# Patient Record
Sex: Female | Born: 1940 | Race: White | Hispanic: No | Marital: Married
Health system: Midwestern US, Community
[De-identification: ages and names within clinical notes are randomized; demographics above are authoritative.]

## PROBLEM LIST (undated history)

## (undated) DIAGNOSIS — E7800 Pure hypercholesterolemia, unspecified: Secondary | ICD-10-CM

## (undated) DIAGNOSIS — E119 Type 2 diabetes mellitus without complications: Secondary | ICD-10-CM

## (undated) DIAGNOSIS — I10 Essential (primary) hypertension: Secondary | ICD-10-CM

## (undated) DIAGNOSIS — F329 Major depressive disorder, single episode, unspecified: Secondary | ICD-10-CM

## (undated) DIAGNOSIS — F32A Depression, unspecified: Secondary | ICD-10-CM

## (undated) HISTORY — PX: TOTAL KNEE ARTHROPLASTY: SHX125

## (undated) HISTORY — PX: CHOLECYSTECTOMY: SHX55

## (undated) HISTORY — PX: TOTAL HIP ARTHROPLASTY: SHX124

## (undated) HISTORY — PX: APPENDECTOMY: SHX54

---

## 2012-02-23 ENCOUNTER — Inpatient Hospital Stay
Admit: 2012-02-23 | Discharge: 2012-02-23 | Disposition: A | Discharge status: Home / Self Care | Attending: Emergency Medicine

## 2012-02-23 ENCOUNTER — Inpatient Hospital Stay: Admit: 2012-02-23 | Discharge: 2012-02-23 | Disposition: A | Discharge status: Home / Self Care

## 2012-02-23 MED ORDER — TRAMADOL HCL 50 MG PO TABS
50 MG | ORAL_TABLET | Freq: Four times a day (QID) | ORAL | Status: AC | PRN
Start: 2012-02-23 — End: ?

## 2012-02-23 NOTE — ED Provider Notes (Signed)
[facility]  eMERGENCY dEPARTMENT eNCOUnter          CHIEF COMPLAINT       Chief Complaint   Patient presents with   . Fall   . Hip Pain     Lt and thigh       Nurses Notes reviewed and I agree except as noted in the HPI.      HISTORY OF PRESENT ILLNESS    Joyce White is a 71 y.o. female who presents Pain in her left hip and thigh after slipping in the shower last Friday.  She states that she did not fall into the ground.  She rates her pain as 6/10 on a pain scale, nonradiating, achy, constant.  She states she is able to walk on it.  She denies any other complaints at this time, denies any chest pain, shortness of breath, dizziness prior to slipping.  Denies hitting her head or any loss of consciousness.     REVIEW OF SYSTEMS     Review of Systems   Constitutional: Negative for fever and chills.   HENT: Negative for ear pain, sore throat, rhinorrhea and neck pain.    Eyes: Negative for discharge.   Respiratory: Negative for cough and shortness of breath.    Cardiovascular: Negative for chest pain.   Gastrointestinal: Negative for nausea, vomiting, abdominal pain and diarrhea.   Genitourinary: Negative for dysuria, vaginal discharge, difficulty urinating and pelvic pain.   Musculoskeletal: Positive for arthralgias. Negative for back pain and joint swelling.   Skin: Negative for color change.   Neurological: Negative for dizziness, seizures, syncope and headaches.          PAST MEDICAL HISTORY    has a past medical history of Diabetes mellitus.    SURGICAL HISTORY      has past surgical history that includes Cholecystectomy; fracture surgery; Appendectomy; and Tubal ligation.    CURRENT MEDICATIONS       Previous Medications    LISINOPRIL (PRINIVIL;ZESTRIL) 10 MG TABLET    Take 10 mg by mouth daily.    LOVASTATIN PO    Take  by mouth.    METOPROLOL TARTRATE    by Does not apply route.       ALLERGIES     is allergic to aspirin.    FAMILY HISTORY     has no family status information on file.  family  history is not on file.    SOCIAL HISTORY      reports that she has never smoked. She does not have any smokeless tobacco history on file. She reports that she does not drink alcohol or use illicit drugs.    PHYSICAL EXAM     INITIAL VITALS:  height is 5\' 4"  (1.626 m) and weight is 217 lb (98.431 kg). Her oral temperature is 98.4 F (36.9 C). Her blood pressure is 140/62 and her pulse is 56. Her respiration is 18 and oxygen saturation is 99%.    Physical Exam   Nursing note and vitals reviewed.  Constitutional: She is oriented to person, place, and time. She appears well-developed and well-nourished.   HENT:   Head: Atraumatic.   Right Ear: External ear normal.   Left Ear: External ear normal.   Eyes: Conjunctivae are normal. Right eye exhibits no discharge. Left eye exhibits no discharge. No scleral icterus.   Neck: Neck supple. No tracheal deviation present.   Cardiovascular: Normal rate and normal heart sounds.  Exam reveals no  gallop and no friction rub.    No murmur heard.  Pulmonary/Chest: Effort normal and breath sounds normal. No stridor. No respiratory distress. She has no wheezes. She has no rales.   Abdominal: Soft. There is no tenderness.   Musculoskeletal: She exhibits no edema and no tenderness.        Right hip: Normal.        Left hip: She exhibits decreased range of motion. She exhibits no tenderness, no bony tenderness, no swelling, no crepitus, no deformity and no laceration.        Left knee: Normal.        Left ankle: Normal.   Neurological: She is alert and oriented to person, place, and time. Coordination normal.   Skin: Skin is warm and dry. She is not diaphoretic.   Psychiatric: She has a normal mood and affect. Her behavior is normal. Judgment and thought content normal.         DIFFERENTIAL DIAGNOSIS:   Hip fracture  Sprain/strain    DIAGNOSTIC RESULTS     EKG: All EKG's are interpreted by the Emergency Department Physician who either signs or Co-signs this chart in the absence of a  cardiologist.  EKG    None      RADIOLOGY: non-plain film images(s) such as CT, Ultrasound and MRI are read by the radiologist.  The patient had a 2 view X-ray of the left hip which demonstrates no acute findings.   [x]  Visualized by me   [x]  Radiologist's Wet Read Report Reviewed   []  Discussed with Radiologist.    LABS:   Labs Reviewed - No data to display    EMERGENCY DEPARTMENT COURSE:   Vitals:    Filed Vitals:    02/23/12 1206   BP: 140/62   Pulse: 56   Temp: 98.4 F (36.9 C)   TempSrc: Oral   Resp: 18   Height: 5\' 4"  (1.626 m)   Weight: 217 lb (98.431 kg)   SpO2: 99%     This is a 71 year old female who presents to the ED where she was evaluated by myself and Dr. Terrilyn Saver.  The patient presents with pain in her left hip and thigh after slipping in the shower last week.  On examination and unable to palpate any tenderness to palpation.  Patient does have decreased range of motion due to pain.  As the pulses are noted to be intact.  Cap refill is less than 3 seconds.  X-rays were obtained and demonstrate no acute findings.  At this time we feel this is most likely a  sprain.  We will discharge her home.  I did instruct her to rest, and Try to stay off of it.    CRITICAL CARE: None      CONSULTS:  None    PROCEDURES:  None    FINAL IMPRESSION      1. Sprain of left hip          DISPOSITION/PLAN   This time the patient is in stable condition will be discharged home.  She is given a prescription for Ultram.  She is instructed to follow up with her family doctor next week for a recheck.  She was instructed to minimal weight bear for the next few days, and to return to the ED for any worsening in her condition, or for any other concerns deem necessary.  She expresses understanding and agreement with the plan of care.    PATIENT REFERRED TO:  In 1 week  Follow up with your doctor      DISCHARGE MEDICATIONS:  New Prescriptions    TRAMADOL (ULTRAM) 50 MG TABLET    Take 1 tablet by mouth every 6 hours as needed for  Pain.       (Please note that portions of this note were completed with a voice recognition program.  Efforts were made to edit the dictations but occasionally words are mis-transcribed.)    Lan Mcneill L. Gaynell Face, PA              Charlee Squibb L. [place], [place]  02/23/12 1340

## 2012-02-23 NOTE — ED Notes (Signed)
Pt stable. Discharge instructions given, verbalized understanding. Rx given. To lobby without difficulty.    Johnnette Litter, RN  02/23/12 1343

## 2012-02-23 NOTE — Discharge Instructions (Signed)
Follow up with your doctor in 1 week for a recheck. Minimal weight bearing for the next 3 days. Take ultram as directed for pain. Return to the ED for any worsening in your condition, or for any other concerns deemed necessary.   Hip Injury  You have a been diagnosed with a hip injury. Falls can cause fractures to the pelvis and hip as well as very painful bruises. These are called 'hip pointers'. Muscle injuries, arthritis, sciatica, and bursitis and can also cause severe pelvic or hip pain. An x-ray exam will usually show a fractured bone, but sometimes other studies like a CT scan or MRI may be needed to be certain about the diagnosis. All painful hip injuries should be treated like there might be a fracture until they are better or determined not to be fractures.  Rest in bed over the next 3-4 days, or as long as you have severe pain. You should not bear weight on your injured hip. Use crutches or a walker. Ice packs can be applied to the injury site for 20 minutes every 2-4 hours for several days. Pain medicine may be needed to help you rest, especially at night.   A follow-up exam and further studies to determine the cause of your pain are important if your symptoms do not improve rapidly over the next week.   SEEK IMMEDIATE MEDICAL CARE IF:   You re-injure your hip.   You develop more pain, a fever, inability to walk, or other problems.  MAKE SURE YOU:    Understand these instructions.   Will watch your condition.   Will get help right away if you are not doing well or get worse.  Document Released: 12/01/2004 Document Revised: 10/13/2011 Document Reviewed: 02/12/2009  [facility] Patient Information 2012 [place].

## 2012-02-23 NOTE — ED Notes (Signed)
Patient fell injurying her hip.  Patient decided to go to [facility] ED to be seen. Patient was not seen at [facility].       Silvana Newness, RN  02/23/12 1056

## 2012-02-23 NOTE — ED Notes (Signed)
Pt in room 34 states slipped in a hotel shower on Friday. Pt states she was stepping out and foot slipped. Pt states she did not go down but twisted leg. C/o Lt hip and though pain 5/10 increase with pressure or movement. Neg LOC. Respirations easy unlabored. Assessment complete.     Johnnette Litter, RN  02/23/12 1210

## 2012-02-23 NOTE — ED Notes (Signed)
Pt resting quietly with eyes closed appears comfortable. Respirations easy unlabored. Side rails up x2.     Johnnette Litter, RN  02/23/12 1320

## 2016-08-06 NOTE — Telephone Encounter (Signed)
Bethany(daughter) Complications after procedure on back. Pain meds not helping. Not sleeping          No answer, left message

## 2016-08-07 ENCOUNTER — Inpatient Hospital Stay: Admit: 2016-08-07 | Discharge: 2016-08-07 | Disposition: A | Discharge status: Home / Self Care | Payer: MEDICARE

## 2016-08-07 DIAGNOSIS — M5416 Radiculopathy, lumbar region: Secondary | ICD-10-CM

## 2016-08-07 HISTORY — PX: BACK SURGERY: SHX140

## 2016-08-07 LAB — CBC WITH AUTO DIFFERENTIAL
Basophils Absolute: 0 10*3/uL (ref 0.0–0.1)
Basophils: 0.2 %
Eosinophils Absolute: 0 10*3/uL (ref 0.0–0.4)
Eosinophils: 0.1 %
Hematocrit: 41.3 % (ref 37.0–47.0)
Hemoglobin: 14 gm/dl (ref 12.0–16.0)
Lymphocytes Absolute: 1.2 10*3/uL (ref 1.0–4.8)
Lymphocytes: 13.8 %
MCH: 30.5 pg (ref 27.0–31.0)
MCHC: 33.8 gm/dl (ref 33.0–37.0)
MCV: 90 fL (ref 81.0–99.0)
MPV: 9.1 mcm (ref 7.4–10.4)
Monocytes Absolute: 0.5 10*3/uL (ref 0.4–1.3)
Monocytes: 5.3 %
Platelets: 226 10*3/uL (ref 130–400)
RBC Morphology: NORMAL
RBC: 4.58 10*6/uL (ref 4.20–5.40)
RDW: 13.4 % (ref 11.5–14.5)
Seg Neutrophils: 80.6 %
Segs Absolute: 6.9 10*3/uL (ref 1.8–7.7)
WBC: 8.6 10*3/uL (ref 4.8–10.8)
nRBC: 0 /100 wbc

## 2016-08-07 LAB — BASIC METABOLIC PANEL
BUN: 17 mg/dL (ref 7–22)
CO2: 30 meq/L (ref 23–33)
Calcium: 9.1 mg/dL (ref 8.5–10.5)
Chloride: 97 meq/L — ABNORMAL LOW (ref 98–111)
Creatinine: 0.8 mg/dL (ref 0.4–1.2)
Glucose: 315 mg/dL — ABNORMAL HIGH (ref 70–108)
Potassium: 4.3 meq/L (ref 3.5–5.2)
Sodium: 138 meq/L (ref 135–145)

## 2016-08-07 LAB — OSMOLALITY: Osmolality Calc: 289.3 mOsmol/kg (ref >=275.0)

## 2016-08-07 LAB — GLOMERULAR FILTRATION RATE, ESTIMATED: Est, Glom Filt Rate: 70 mL/min/{1.73_m2} — AB

## 2016-08-07 LAB — ANION GAP: Anion Gap: 11 meq/L (ref 8.0–16.0)

## 2016-08-07 MED ORDER — LIDOCAINE 5 % EX PTCH
5 % | Freq: Every day | CUTANEOUS | Status: DC
Start: 2016-08-07 — End: 2016-08-07
  Administered 2016-08-07: 11:00:00 1 via TRANSDERMAL

## 2016-08-07 MED ORDER — PREDNISONE 20 MG PO TABS
20 MG | ORAL_TABLET | ORAL | 0 refills | Status: AC
Start: 2016-08-07 — End: 2016-08-17

## 2016-08-07 MED ORDER — OXYCODONE-ACETAMINOPHEN 5-325 MG PO TABS
5-325 MG | ORAL_TABLET | Freq: Four times a day (QID) | ORAL | 0 refills | Status: AC | PRN
Start: 2016-08-07 — End: 2016-08-14

## 2016-08-07 MED ORDER — ONDANSETRON HCL 4 MG/2ML IJ SOLN
4 MG/2ML | Freq: Once | INTRAMUSCULAR | Status: AC
Start: 2016-08-07 — End: 2016-08-07
  Administered 2016-08-07: 11:00:00 4 mg via INTRAVENOUS

## 2016-08-07 MED ORDER — SODIUM CHLORIDE 0.9 % IV BOLUS
0.9 % | Freq: Once | INTRAVENOUS | Status: AC
Start: 2016-08-07 — End: 2016-08-07
  Administered 2016-08-07: 11:00:00 500 mL via INTRAVENOUS

## 2016-08-07 MED ORDER — FENTANYL CITRATE (PF) 100 MCG/2ML IJ SOLN
100 MCG/2ML | Freq: Once | INTRAMUSCULAR | Status: DC
Start: 2016-08-07 — End: 2016-08-07

## 2016-08-07 MED ORDER — TIZANIDINE HCL 4 MG PO TABS
4 MG | ORAL_TABLET | Freq: Four times a day (QID) | ORAL | 0 refills | Status: AC | PRN
Start: 2016-08-07 — End: ?

## 2016-08-07 MED ORDER — MORPHINE SULFATE (PF) 4 MG/ML IV SOLN
4 MG/ML | Freq: Once | INTRAVENOUS | Status: AC
Start: 2016-08-07 — End: 2016-08-07
  Administered 2016-08-07: 11:00:00 4 mg via INTRAVENOUS

## 2016-08-07 MED ORDER — TIZANIDINE HCL 4 MG PO TABS
4 MG | Freq: Once | ORAL | Status: AC
Start: 2016-08-07 — End: 2016-08-07
  Administered 2016-08-07: 11:00:00 4 mg via ORAL

## 2016-08-07 MED FILL — MORPHINE SULFATE (PF) 4 MG/ML IV SOLN: 4 mg/mL | INTRAVENOUS | Qty: 1

## 2016-08-07 MED FILL — LIDODERM 5 % EX PTCH: 5 % | CUTANEOUS | Qty: 1

## 2016-08-07 MED FILL — ONDANSETRON HCL 4 MG/2ML IJ SOLN: 4 MG/2ML | INTRAMUSCULAR | Qty: 2

## 2016-08-07 MED FILL — TIZANIDINE HCL 4 MG PO TABS: 4 MG | ORAL | Qty: 1

## 2016-08-07 NOTE — Telephone Encounter (Signed)
No further contact, will close encounter.

## 2016-08-07 NOTE — ED Provider Notes (Signed)
[facility]    CHIEF COMPLAINT       Chief Complaint   Patient presents with   ??? Back Pain       Nurses Notes reviewed and I agree except as noted in the HPI.    HISTORY OF PRESENT ILLNESS    Joyce White is a 75 y.o. female who presents to the ED for evaluation of low back pain. The patient has a history of chronic neuropathic back pain. She states she had a radiofrequency ablation at pain management done last week on her right side, noting she normally gets them done on the left side. Since then she has had increasing pain of her left lower back, radiating down the side of her left leg. She states it is a sharp, burning pain and has been constant since Wednesday. She went to urgent care and received a week supply of prednisone and Norco, with minimal relief. She states the pain is worse and awakening her from her sleep. She denies loss of bowel or bladder function, fever, chills, weight loss, or trauma. Her last MRI was in 2016.         Pain description:  Onset: progressive for 1 week  Location: left lower back  Duration: constant   Character: sharp, burning   Aggravating factors:   Radiation: down lateral aspect of left leg  Timing: 1 week  Severity: 10/10    Experienced previously: this is a chronic issue that she follows pain management for     HPI was provided by the patient.    REVIEW OF SYSTEMS     Review of Systems   Constitutional: Negative for appetite change, chills, diaphoresis, fatigue, fever and unexpected weight change.   HENT: Negative for congestion, hearing loss, postnasal drip, rhinorrhea, sinus pressure, sore throat and voice change.    Eyes: Negative for photophobia, redness and visual disturbance.   Respiratory: Negative for cough, chest tightness, shortness of breath and wheezing.    Cardiovascular: Negative for chest pain, palpitations and leg swelling.   Gastrointestinal: Negative for abdominal distention, abdominal pain, blood in stool, constipation, diarrhea,  nausea and vomiting.   Endocrine: Negative for cold intolerance, heat intolerance, polydipsia, polyphagia and polyuria.   Genitourinary: Negative for difficulty urinating, dysuria, flank pain, frequency and vaginal pain.   Musculoskeletal: Positive for back pain (left lower back and leg pain) and myalgias. Negative for arthralgias, gait problem, joint swelling, neck pain and neck stiffness.   Skin: Negative for color change, pallor and rash.   Allergic/Immunologic: Negative for immunocompromised state.   Neurological: Negative for dizziness, tremors, weakness, light-headedness, numbness and headaches.   Hematological: Does not bruise/bleed easily.   Psychiatric/Behavioral: Negative for behavioral problems, confusion, decreased concentration and suicidal ideas. The patient is nervous/anxious.         PAST MEDICAL HISTORY    has a past medical history of Arthritis; Back pain at L4-L5 level; Diabetes mellitus (HCC); Hyperlipidemia; Hypertension; and Thyroid disease.    SURGICAL HISTORY      has a past surgical history that includes Cholecystectomy; fracture surgery; Appendectomy; Tubal ligation; joint replacement; Thyroidectomy, partial (Left, 2015); and back surgery.    CURRENT MEDICATIONS       Discharge Medication List as of 08/07/2016  8:06 AM      CONTINUE these medications which have NOT CHANGED    Details   acetaminophen (TYLENOL) 500 MG tablet Take 500 mg by mouth 2 times dailyHistorical Med      lisinopril (  PRINIVIL;ZESTRIL) 10 MG tablet Take 10 mg by mouth daily.      LOVASTATIN PO Take  by mouth.      METOPROLOL TARTRATE by Does not apply route.      traMADol (ULTRAM) 50 MG tablet Take 1 tablet by mouth every 6 hours as needed for Pain., Disp-20 tablet, R-0             ALLERGIES     has No Known Allergies.    FAMILY HISTORY     indicated that her mother is deceased. She indicated that her father is deceased. She indicated that the status of her brother is unknown.  family history includes Diabetes in her  father; High Blood Pressure in her father and mother; Parkinsonism in her brother and mother; Stroke in her mother.    SOCIAL HISTORY      reports that she has never smoked. She does not have any smokeless tobacco history on file. She reports that she does not drink alcohol or use illicit drugs.    PHYSICAL EXAM     INITIAL VITALS:  height is 5\' 4"  (1.626 m) and weight is 220 lb (99.8 kg). Her oral temperature is 98 ??F (36.7 ??C). Her blood pressure is 160/68 (abnormal) and her pulse is 58. Her respiration is 20 and oxygen saturation is 94%.    Physical Exam   Constitutional: She is oriented to person, place, and time. She appears well-developed and well-nourished.   She appears in obvious discomfort   HENT:   Head: Normocephalic and atraumatic.   Right Ear: External ear normal.   Left Ear: External ear normal.   Mouth/Throat: Uvula is midline and oropharynx is clear and moist.   Eyes: Conjunctivae and EOM are normal. Pupils are equal, round, and reactive to light.   Neck: Normal range of motion. Neck supple.   Cardiovascular: Normal rate, regular rhythm, S1 normal, S2 normal, normal heart sounds and intact distal pulses.    Pulmonary/Chest: Effort normal and breath sounds normal. No respiratory distress. She exhibits no tenderness.   Abdominal: Soft. Normal appearance and bowel sounds are normal. She exhibits no distension. There is no tenderness.   Musculoskeletal: Normal range of motion.   Full passive range of motion; decreased active range of motion of left LE due to pain. Tenderness along SI joint. No deformities noted. Sensation intact.    Lymphadenopathy:     She has no cervical adenopathy.   Neurological: She is alert and oriented to person, place, and time. No cranial nerve deficit.   Skin: Skin is warm, dry and intact.   Psychiatric: She has a normal mood and affect. Her speech is normal and behavior is normal. Thought content normal.   Nursing note and vitals reviewed.      DIFFERENTIAL DIAGNOSIS:    Neuropathic pain, nerve compression, muscle strain, chronic back pain    DIAGNOSTIC RESULTS     EKG: All EKG's are interpreted by the Emergency Department Physician who either signs or Co-signs this chart in the absence of a cardiologist.    none    RADIOLOGY: non-plain film images(s) such as CT, Ultrasound and MRI are read by the radiologist.  Plain radiographic images are visualized and preliminarily interpreted by the emergency physician unless otherwise stated below.  No orders to display         LABS:   Labs Reviewed   BASIC METABOLIC PANEL - Abnormal; Notable for the following:        Result Value  Chloride 97 (*)     Glucose 315 (*)     All other components within normal limits   GLOMERULAR FILTRATION RATE, ESTIMATED - Abnormal; Notable for the following:     Est, Glom Filt Rate 70 (*)     All other components within normal limits   CBC WITH AUTO DIFFERENTIAL   ANION GAP   OSMOLALITY   URINE RT REFLEX TO CULTURE       EMERGENCY DEPARTMENT COURSE:   Vitals:    Vitals:    08/07/16 0620 08/07/16 0726 08/07/16 0731 08/07/16 0805   BP: (!) 179/99 (!) 206/86 (!) 180/94 (!) 160/68   Pulse: 64 58 58    Resp: 20 18 20     Temp:       TempSrc:       SpO2: 94% 95% 94%    Weight:       Height:           MDM    Patient was seen and evaluated in the emergency department, during my initial evaluation the patient appeared to be in significant discomfort.  Labs Were obtained no significant abnormalities were noted.  Patient was treated medications below I noted significant improvement in patient's symptoms.  I discussed my findings my plan of care with the patient and she was amenable with discharge and follow-up with her pain management specialist.  I reviewed the patient's OA RR S report, no red flags are really identified at this point.  We will change the patient's medicine to Percocet, we will provide her with Zanaflex as well, she is advised to continue to walk as tolerated.  She verbalized understanding. All here in  the emergency department she maintained stable course is appropriate for discharge.    Medications   0.9 % sodium chloride bolus (0 mLs Intravenous Stopped 08/07/16 0805)   tiZANidine (ZANAFLEX) tablet 4 mg (4 mg Oral Given 08/07/16 0718)   morphine (PF) injection 4 mg (4 mg Intravenous Given 08/07/16 0648)   ondansetron (ZOFRAN) injection 4 mg (4 mg Intravenous Given 08/07/16 1610)       Patient was seen independently by myself. The patient's final impression and disposition and plan was determined by myself.     CRITICAL CARE:   None    CONSULTS:  None    PROCEDURES:  None    FINAL IMPRESSION      1. Lumbar radiculopathy, acute          DISPOSITION/PLAN   Patient discharged in stable condition    PATIENT REFERRED TO:  Your pain doctor     Call in 1 day  For follow up and evaluation       DISCHARGE MEDICATIONS:  Discharge Medication List as of 08/07/2016  8:06 AM      START taking these medications    Details   oxyCODONE-acetaminophen (PERCOCET) 5-325 MG per tablet Take 1 tablet by mouth every 6 hours as needed for Pain  WARNING:  May cause drowsiness.  May impair ability to operate vehicles or machinery.  Do not use in combination with alcohol.  ., Disp-20 tablet, R-0Print      tiZANidine (ZANAFLEX) 4 MG tablet Take 1 tablet by mouth every 6 hours as needed (back spasm), Disp-20 tablet, R-0Print             (Please note that portions of this note were completed with a voice recognition program.  Efforts were made to edit the dictations but occasionally words are mis-transcribed.)  Provider:  I personally performed the services described in the documentation, reviewed and edited the documentation which was dictated to the scribe in my presence, and it accurately records my words and actions.    Matilynn Dacey, CNP 08/07/16 4:46 PM    Sophiamarie Nease, NP       Biannca Scantlin, NP  08/07/16 1649

## 2016-08-07 NOTE — ED Triage Notes (Signed)
Over one week ago had an ablation on right L4-5 and now has pain in the left lower back with shooting pain down left leg

## 2016-08-07 NOTE — Discharge Instructions (Signed)
Back Pain, Emergency or Urgent Symptoms: Care Instructions  Your Care Instructions  Many people have back pain at one time or another. In most cases, pain gets better with self-care that includes over-the-counter pain medicine, ice, heat, and exercises.  Unless you have symptoms of a severe injury or heart attack, you may be able to give yourself a few days before you call a doctor. But some back problems are very serious. Do not ignore symptoms that need to be checked right away.  Follow-up care is a key part of your treatment and safety. Be sure to make and go to all appointments, and call your doctor if you are having problems. It's also a good idea to know your test results and keep a list of the medicines you take.  How can you care for yourself at home?   Sit or lie in positions that are most comfortable and that reduce your pain. Try one of these positions when you lie down:   Lie on your back with your knees bent and supported by large pillows.   Lie on the floor with your legs on the seat of a sofa or chair.   Lie on your side with your knees and hips bent and a pillow between your legs.   Lie on your stomach if it does not make pain worse.   Do not sit up in bed, and avoid soft couches and twisted positions. Bed rest can help relieve pain at first, but it delays healing. Avoid bed rest after the first day.   Change positions every 30 minutes. If you must sit for long periods of time, take breaks from sitting. Get up and walk around, or lie flat.   Try using a heating pad on a low or medium setting, for 15 to 20 minutes every 2 or 3 hours. Try a warm shower in place of one session with the heating pad. You can also buy single-use heat wraps that last up to 8 hours. You can also try ice or cold packs on your back for 10 to 20 minutes at a time, several times a day. (Put a thin cloth between the ice pack and your skin.) This reduces pain and makes it easier to be active and exercise.   Take pain  medicines exactly as directed.   If the doctor gave you a prescription medicine for pain, take it as prescribed.   If you are not taking a prescription pain medicine, ask your doctor if you can take an over-the-counter medicine.  When should you call for help?  Call 911 anytime you think you may need emergency care. For example, call if:   You are unable to move a leg at all.   You have back pain with severe belly pain.   You have symptoms of a heart attack. These may include:   Chest pain or pressure, or a strange feeling in the chest.   Sweating.   Shortness of breath.   Nausea or vomiting.   Pain, pressure, or a strange feeling in the back, neck, jaw, or upper belly or in one or both shoulders or arms.   Lightheadedness or sudden weakness.   A fast or irregular heartbeat.  After you call 911, the operator may tell you to chew 1 adult-strength or 2 to 4 low-dose aspirin. Wait for an ambulance. Do not try to drive yourself.  Call your doctor now or seek immediate medical care if:   You have new or worse   symptoms in your arms, legs, chest, belly, or buttocks. Symptoms may include:   Numbness or tingling.   Weakness.   Pain.   You lose bladder or bowel control.   You have back pain and:   You have injured your back while lifting or doing some other activity. Call if the pain is severe, has not gone away after 1 or 2 days, and you cannot do your normal daily activities.   You have had a back injury before that needed treatment.   Your pain has lasted longer than 4 weeks.   You have had weight loss you cannot explain.   You are age 75 or older.   You have cancer now or have had it before.  Watch closely for changes in your health, and be sure to contact your doctor if you are not getting better as expected.  Where can you learn more?  Go to [email or web address] and sign in to your MyChart account. Enter S904 in the Search Health Information box to learn more about "Back Pain,  Emergency or Urgent Symptoms: Care Instructions."     If you do not have an account, please click on the "Sign Up Now" link.  Current as of: January 25, 2016  Content Version: 11.3   2006-2017 [employer]. Care instructions adapted under license by [facility]. If you have questions about a medical condition or this instruction, always ask your healthcare professional. [employer] disclaims any warranty or liability for your use of this information.

## 2016-08-07 NOTE — ED Notes (Signed)
Pt appears anxious, withering in pain. Left hand continuously placing pressure to left lower back. Pt rates pain 9/10 at this time. Muscle relaxer, and lidocaine patch to left lower back provided. Pt repositioned back in bed. Monitor in place. Daughter at bedside. Call light within reach. Will continue to monitor for safety and comfort.       Brantley Flingargis J Saraia Platner, RN  08/07/16 0730

## 2016-09-10 ENCOUNTER — Emergency Department (HOSPITAL_COMMUNITY)
Admission: EM | Admit: 2016-09-10 | Discharge: 2016-09-10 | Disposition: A | Discharge status: Home / Self Care | Payer: Medicare Other | Attending: Emergency Medicine | Admitting: Emergency Medicine

## 2016-09-10 ENCOUNTER — Encounter (HOSPITAL_COMMUNITY): Payer: Self-pay

## 2016-09-10 DIAGNOSIS — T368X5A Adverse effect of other systemic antibiotics, initial encounter: Secondary | ICD-10-CM | POA: Diagnosis not present

## 2016-09-10 DIAGNOSIS — T887XXA Unspecified adverse effect of drug or medicament, initial encounter: Secondary | ICD-10-CM | POA: Insufficient documentation

## 2016-09-10 DIAGNOSIS — Z96651 Presence of right artificial knee joint: Secondary | ICD-10-CM | POA: Diagnosis not present

## 2016-09-10 DIAGNOSIS — I10 Essential (primary) hypertension: Secondary | ICD-10-CM | POA: Insufficient documentation

## 2016-09-10 DIAGNOSIS — Y658 Other specified misadventures during surgical and medical care: Secondary | ICD-10-CM | POA: Diagnosis not present

## 2016-09-10 DIAGNOSIS — Z96641 Presence of right artificial hip joint: Secondary | ICD-10-CM | POA: Diagnosis not present

## 2016-09-10 DIAGNOSIS — R441 Visual hallucinations: Secondary | ICD-10-CM | POA: Insufficient documentation

## 2016-09-10 DIAGNOSIS — E119 Type 2 diabetes mellitus without complications: Secondary | ICD-10-CM | POA: Diagnosis not present

## 2016-09-10 DIAGNOSIS — T50905A Adverse effect of unspecified drugs, medicaments and biological substances, initial encounter: Secondary | ICD-10-CM

## 2016-09-10 HISTORY — DX: Depression, unspecified: F32.A

## 2016-09-10 HISTORY — DX: Major depressive disorder, single episode, unspecified: F32.9

## 2016-09-10 HISTORY — DX: Essential (primary) hypertension: I10

## 2016-09-10 HISTORY — DX: Pure hypercholesterolemia, unspecified: E78.00

## 2016-09-10 HISTORY — DX: Type 2 diabetes mellitus without complications: E11.9

## 2016-09-10 LAB — COMPREHENSIVE METABOLIC PANEL
ALBUMIN: 3.3 g/dL — AB (ref 3.5–5.0)
ALK PHOS: 69 U/L (ref 38–126)
ALT: 17 U/L (ref 14–54)
ANION GAP: 11 (ref 5–15)
AST: 21 U/L (ref 15–41)
BUN: 7 mg/dL (ref 6–20)
CO2: 25 mmol/L (ref 22–32)
Calcium: 8.9 mg/dL (ref 8.9–10.3)
Chloride: 103 mmol/L (ref 101–111)
Creatinine, Ser: 0.85 mg/dL (ref 0.44–1.00)
GFR calc non Af Amer: >60 mL/min (ref >60)
GLUCOSE: 153 mg/dL — AB (ref 65–99)
Potassium: 3.3 mmol/L — ABNORMAL LOW (ref 3.5–5.1)
SODIUM: 139 mmol/L (ref 135–145)
Total Bilirubin: 0.8 mg/dL (ref 0.3–1.2)
Total Protein: 6.1 g/dL — ABNORMAL LOW (ref 6.5–8.1)

## 2016-09-10 LAB — URINE MICROSCOPIC-ADD ON

## 2016-09-10 LAB — CBC WITH DIFFERENTIAL/PLATELET
Basophils Absolute: 0 10*3/uL (ref 0.0–0.1)
Basophils Relative: 0 %
Eosinophils Absolute: 1 10*3/uL — ABNORMAL HIGH (ref 0.0–0.7)
Eosinophils Relative: 14 %
HEMATOCRIT: 37.6 % (ref 36.0–46.0)
HEMOGLOBIN: 12.8 g/dL (ref 12.0–15.0)
LYMPHS ABS: 1.6 10*3/uL (ref 0.7–4.0)
LYMPHS PCT: 22 %
MCH: 30.8 pg (ref 26.0–34.0)
MCHC: 34 g/dL (ref 30.0–36.0)
MCV: 90.4 fL (ref 78.0–100.0)
MONO ABS: 0.6 10*3/uL (ref 0.1–1.0)
MONOS PCT: 8 %
NEUTROS ABS: 3.8 10*3/uL (ref 1.7–7.7)
NEUTROS PCT: 56 %
Platelets: 221 10*3/uL (ref 150–400)
RBC: 4.16 MIL/uL (ref 3.87–5.11)
RDW: 14 % (ref 11.5–15.5)
WBC: 6.9 10*3/uL (ref 4.0–10.5)

## 2016-09-10 LAB — URINALYSIS, ROUTINE W REFLEX MICROSCOPIC
BILIRUBIN URINE: NEGATIVE
GLUCOSE, UA: NEGATIVE mg/dL
HGB URINE DIPSTICK: NEGATIVE
Ketones, ur: 15 mg/dL — AB
NITRITE: NEGATIVE
PH: 7 (ref 5.0–8.0)
Protein, ur: NEGATIVE mg/dL
SPECIFIC GRAVITY, URINE: 1.01 (ref 1.005–1.030)

## 2016-09-10 MED ORDER — ONDANSETRON HCL 4 MG PO TABS: 4.0000 mg | ORAL_TABLET | Freq: Three times a day (TID) | ORAL | 0 refills | Status: AC | PRN

## 2016-09-10 MED ORDER — ONDANSETRON 4 MG PO TBDP
8.0000 mg | ORAL_TABLET | Freq: Once | ORAL | Status: AC
  Administered 2016-09-10: 8 mg via ORAL
  Filled 2016-09-10: qty 2

## 2016-09-10 MED ORDER — OXYCODONE-ACETAMINOPHEN 5-325 MG PO TABS
1.0000 | ORAL_TABLET | Freq: Once | ORAL | Status: AC
Start: 2016-09-10 — End: 2016-09-10
  Administered 2016-09-10: 1 via ORAL
  Filled 2016-09-10: qty 1

## 2016-09-10 NOTE — ED Provider Notes (Signed)
[facility]PT Provider Note   CSN: [id number] Arrival date & time: 09/10/16  1927     History   Chief Complaint Chief Complaint  Patient presents with  . Medication Reaction    HPI Renee Stewart is a 75 y.o. female.  She was diagnosed with urinary tract infection on October 26, and prescribed ciprofloxacin. Since then, she is at problems with nausea, and also with visual and auditory hallucinations. She is also taking oxycodone-acetaminophen which was prescribed from a pain management center. This is being given for left iliotibial band syndrome. Since the prescription was given, [place] has [place]. She denies dysuria and denies fever or chills. She has not been established with physicians locally.   The history is provided by the patient.    Past Medical History:  Diagnosis Date  . Depression   . Diabetes mellitus without complication (HCC)   . Hypercholesteremia   . Hypertension     There are no active problems to display for this patient.   Past Surgical History:  Procedure Laterality Date  . APPENDECTOMY    . BACK SURGERY  08/2016   ablation   . CHOLECYSTECTOMY    . TOTAL HIP ARTHROPLASTY Right   . TOTAL KNEE ARTHROPLASTY Right     OB History    No data available       Home Medications    Prior to Admission medications   Not on File    Family History History reviewed. No pertinent family history.  Social History Social History  Substance Use Topics  . Smoking status: Never Smoker  . Smokeless tobacco: Never Used  . Alcohol use No     Allergies   Review of patient's allergies indicates no known allergies.   Review of Systems Review of Systems  All other systems reviewed and are negative.    Physical Exam Updated Vital Signs BP 189/88   Pulse 80   Temp 98 F (36.7 C) (Oral)   Resp 21   Ht 5\' 4"  (1.626 m)   Wt 224 lb 5 oz (101.7 kg)   SpO2 97%   BMI 38.50 kg/m   Physical Exam  Nursing note and  vitals reviewed.  75 year old female, resting comfortably and in no acute distress. Vital signs are significant for hypertension. Oxygen saturation is 97%, which is normal. Head is normocephalic and atraumatic. PERRLA, EOMI. Oropharynx is clear. Neck is nontender and supple without adenopathy or JVD. Back is nontender and there is no CVA tenderness. Lungs are clear without rales, wheezes, or rhonchi. Chest is nontender. Heart has regular rate and rhythm without murmur. Abdomen is soft, flat, nontender without masses or hepatosplenomegaly and peristalsis is normoactive. Extremities have no cyanosis or edema, full range of motion is present. Skin is warm and dry without rash. Neurologic: She is awake and alert and oriented, thought processes are appropriate, cranial nerves are intact, there are no motor or sensory deficits.  ED Treatments / Results  Labs (all labs ordered are listed, but only abnormal results are displayed) Labs Reviewed  COMPREHENSIVE METABOLIC PANEL - Abnormal; Notable for the following:       Result Value   Potassium 3.3 (*)    Glucose, Bld 153 (*)    Total Protein 6.1 (*)    Albumin 3.3 (*)    All other components within normal limits  CBC WITH DIFFERENTIAL/PLATELET - Abnormal; Notable for the following:    Eosinophils Absolute 1.0 (*)    All other  components within normal limits  URINALYSIS, ROUTINE W REFLEX MICROSCOPIC (NOT [facility]) - Abnormal; Notable for the following:    Ketones, ur 15 (*)    Leukocytes, UA SMALL (*)    All other components within normal limits  URINE MICROSCOPIC-ADD ON - Abnormal; Notable for the following:    Squamous Epithelial / LPF 0-5 (*)    Bacteria, UA FEW (*)    All other components within normal limits   Procedures Procedures (including critical care time)  Medications Ordered in ED Medications  ondansetron (ZOFRAN-ODT) disintegrating tablet 8 mg (not administered)  oxyCODONE-acetaminophen (PERCOCET/ROXICET) 5-325 MG per  tablet 1 tablet (not administered)     Initial Impression / Assessment and Plan / ED Course  I have reviewed the triage vital signs and the nursing notes.  Pertinent labs & imaging results that were available during my care of the patient were reviewed by me and considered in my medical decision making (see chart for details).  Clinical Course   Hallucinations most likely secondary to ciprofloxacin use. Nausea is also likely a side effect of ciprofloxacin. All of her other medications have been taken on a chronic basis. No old records are available. We'll check screening labs and repeat urinalysis. Anticipate discontinuing ciprofloxacin. She will be given ondansetron for nausea.  ED workup is unremarkable. Urine shows no evidence of infection. Normal renal function and hepatic function. Hallucinations are known complication of fluoroquinolone use. She is advised to discontinue ciprofloxacin. She had good relief of nausea with ondansetron and is given a prescription for same.  Final Clinical Impressions(s) / ED Diagnoses   Final diagnoses:  Visual hallucinations  Adverse effect of drug, initial encounter    New Prescriptions New Prescriptions   ONDANSETRON (ZOFRAN) 4 MG TABLET    Take 1 tablet (4 mg total) by mouth every 8 (eight) hours as needed for nausea or vomiting.     Dione Boozeavid Ovie Cornelio, MD 09/10/16 2242

## 2016-09-10 NOTE — Discharge Instructions (Signed)
Stop taking ciprofloxacin - it is causing you to have the hallucinations. It may take several days before they resolve.  Continue all of your other medications.

## 2016-09-10 NOTE — ED Triage Notes (Signed)
Pt started Ciprofloxacin 500 mg BID on 09-01-16 for UTI since then heart has been racing, nauseated and several times pt has seen things that aren't there.

## 2016-09-10 NOTE — ED Triage Notes (Signed)
Pt is taking Percocet every 6 hours for the past several months for left leg pain.

## 2016-09-10 NOTE — ED Notes (Signed)
Pt asked to go to the bathroom pt ambulated well

## 2016-09-10 NOTE — ED Notes (Signed)
Pt is in stable condition upon d/c and is escorted from ED via wheelchair. 

## 2016-09-23 DIAGNOSIS — G8929 Other chronic pain: Secondary | ICD-10-CM | POA: Diagnosis not present

## 2016-09-23 DIAGNOSIS — E1165 Type 2 diabetes mellitus with hyperglycemia: Secondary | ICD-10-CM | POA: Diagnosis not present

## 2016-09-23 DIAGNOSIS — E782 Mixed hyperlipidemia: Secondary | ICD-10-CM | POA: Diagnosis not present

## 2016-09-23 DIAGNOSIS — Z79899 Other long term (current) drug therapy: Secondary | ICD-10-CM | POA: Diagnosis not present

## 2016-09-23 DIAGNOSIS — I10 Essential (primary) hypertension: Secondary | ICD-10-CM | POA: Diagnosis not present

## 2016-10-06 DIAGNOSIS — M79652 Pain in left thigh: Secondary | ICD-10-CM | POA: Diagnosis not present

## 2016-10-10 ENCOUNTER — Other Ambulatory Visit (HOSPITAL_COMMUNITY): Payer: Self-pay | Admitting: Internal Medicine

## 2016-10-10 DIAGNOSIS — R1110 Vomiting, unspecified: Secondary | ICD-10-CM

## 2016-10-10 DIAGNOSIS — E782 Mixed hyperlipidemia: Secondary | ICD-10-CM | POA: Diagnosis not present

## 2016-10-10 DIAGNOSIS — R1319 Other dysphagia: Secondary | ICD-10-CM

## 2016-10-10 DIAGNOSIS — R1310 Dysphagia, unspecified: Secondary | ICD-10-CM | POA: Diagnosis not present

## 2016-10-10 DIAGNOSIS — E1165 Type 2 diabetes mellitus with hyperglycemia: Secondary | ICD-10-CM | POA: Diagnosis not present

## 2016-10-10 DIAGNOSIS — M79652 Pain in left thigh: Secondary | ICD-10-CM | POA: Diagnosis not present

## 2016-10-10 DIAGNOSIS — E041 Nontoxic single thyroid nodule: Secondary | ICD-10-CM | POA: Diagnosis not present

## 2016-10-13 ENCOUNTER — Ambulatory Visit (HOSPITAL_COMMUNITY)
Admission: RE | Admit: 2016-10-13 | Discharge: 2016-10-13 | Disposition: A | Discharge status: Home / Self Care | Payer: Medicare Other | Source: Ambulatory Visit | Attending: Internal Medicine | Admitting: Internal Medicine

## 2016-10-13 ENCOUNTER — Other Ambulatory Visit (HOSPITAL_COMMUNITY): Payer: Self-pay | Admitting: Internal Medicine

## 2016-10-13 DIAGNOSIS — R1310 Dysphagia, unspecified: Secondary | ICD-10-CM

## 2016-10-13 DIAGNOSIS — R1110 Vomiting, unspecified: Secondary | ICD-10-CM | POA: Diagnosis not present

## 2016-10-13 DIAGNOSIS — R1319 Other dysphagia: Secondary | ICD-10-CM

## 2016-10-13 DIAGNOSIS — R112 Nausea with vomiting, unspecified: Secondary | ICD-10-CM | POA: Diagnosis not present

## 2016-10-13 DIAGNOSIS — K224 Dyskinesia of esophagus: Secondary | ICD-10-CM | POA: Diagnosis not present

## 2016-10-17 ENCOUNTER — Encounter (INDEPENDENT_AMBULATORY_CARE_PROVIDER_SITE_OTHER): Payer: Self-pay | Admitting: Orthopedic Surgery

## 2016-10-17 ENCOUNTER — Encounter (INDEPENDENT_AMBULATORY_CARE_PROVIDER_SITE_OTHER): Payer: Self-pay

## 2016-10-17 ENCOUNTER — Ambulatory Visit (INDEPENDENT_AMBULATORY_CARE_PROVIDER_SITE_OTHER): Payer: Medicare Other | Admitting: Orthopedic Surgery

## 2016-10-17 ENCOUNTER — Ambulatory Visit (INDEPENDENT_AMBULATORY_CARE_PROVIDER_SITE_OTHER): Payer: Medicare Other

## 2016-10-17 DIAGNOSIS — M4726 Other spondylosis with radiculopathy, lumbar region: Secondary | ICD-10-CM

## 2016-10-17 DIAGNOSIS — M25552 Pain in left hip: Secondary | ICD-10-CM

## 2016-10-17 NOTE — Progress Notes (Signed)
Office Visit Note   Patient: Renee RocherJolene Stewart           Date of Birth: 12/28/1940           MRN: [id number] Visit Date: 10/17/2016 Requested by: No referring provider defined for this encounter. PCP: Pcp Not In System  Subjective: Chief Complaint  Patient presents with  . Left Hip - Pain    HPI Renee ShoneJulian is a 75 year old patient with 2 half month history of relatively acute onset left pain which she localizes both to the groin in the posterior buttock.  She feels like the left groin at times feels like it's "out of joint".  She is using a walker.  It occurs on daily basis sometimes every 15 minutes.  Denies any numbness and tingling in the leg.  She has had back problems with ablation 3 in the past 6 years.  She is taking hydrocodone 3 a day.  She states that she can't lay sit or stand.  Denies any right-sided radicular symptoms.  She takes Motrin in between the hydrocodone.  She scheduled for pain management tomorrow area and              Review of Systems All systems reviewed are negative as they relate to the chief complaint within the history of present illness.  Patient denies  fevers or chills.    Assessment & Plan: Visit Diagnoses:  1. Pain of left hip joint   2. Pain in left hip     Plan: Impression is left hip pain which may be referred pain from the back which is most likely or it could be intrinsic hip pathology which is not apparent on plain radiographs which looked normal in terms of absence of arthritis.  Plan is diagnostic left hip injection as well as MRI scan of the lumbar spine.  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    Follow-Up Instructions: No Follow-up on file.   Orders:  Orders Placed This Encounter  Procedures  . XR HIP UNILAT W OR W/O PELVIS 2-3 VIEWS LEFT   No orders of the defined types were placed in this encounter.     Procedures: No procedures performed   Clinical Data: No additional  findings.  Objective: Vital Signs: There were no vitals taken for this visit.  Physical Exam   Constitutional: Patient appears well-developed HEENT:  Head: Normocephalic Eyes:EOM are normal Neck: Normal range of motion Cardiovascular: Normal rate Pulmonary/chest: Effort normal Neurologic: Patient is alert Skin: Skin is warm Psychiatric: Patient has normal mood and affect    Ortho Exam examination demonstrates no groin pain with internal/external rotation of the leg she does have some hip flexor weakness on the left compared to the right ankle dorsi flexion plantar flexion inversion eversion strength is intact slightly weak with plantar flexion of the left compared to the right no definite paresthesias L1 S1 bilaterally negative Babinski negative clonus.  Both feet are perfused.  Has some trochanteric tenderness on the right but not on the left.  Skin is intact in the back region.  Mild pain with forward lateral bending   Specialty Comments:  No specialty comments available.  Imaging: Xr Hip Unilat W Or W/o Pelvis 2-3 Views Left  Result Date: 10/17/2016 AP pelvis lateral left hip reviewed.  No degenerative changes present around the hip joint.  Trochanters intact.  Radio opaque dye present within the colon.  Mild degenerative changes seen in the lower lumbar  spine.  Pelvis otherwise unremarkable    PMFS History: There are no active problems to display for this patient.  Past Medical History:  Diagnosis Date  . Depression   . Diabetes mellitus without complication (HCC)   . Hypercholesteremia   . Hypertension     No family history on file.  Past Surgical History:  Procedure Laterality Date  . APPENDECTOMY    . BACK SURGERY  08/2016   ablation   . CHOLECYSTECTOMY    . TOTAL HIP ARTHROPLASTY Right   . TOTAL KNEE ARTHROPLASTY Right    Social History   Occupational History  . Not on file.   Social History Main Topics  . Smoking status: Never Smoker  . Smokeless  tobacco: Never Used  . Alcohol use No  . Drug use: No  . Sexual activity: Not on file

## 2016-10-18 DIAGNOSIS — Z79891 Long term (current) use of opiate analgesic: Secondary | ICD-10-CM | POA: Diagnosis not present

## 2016-10-18 DIAGNOSIS — G894 Chronic pain syndrome: Secondary | ICD-10-CM | POA: Diagnosis not present

## 2016-10-18 DIAGNOSIS — Z79899 Other long term (current) drug therapy: Secondary | ICD-10-CM | POA: Diagnosis not present

## 2016-10-18 DIAGNOSIS — M25559 Pain in unspecified hip: Secondary | ICD-10-CM | POA: Diagnosis not present

## 2016-10-18 DIAGNOSIS — M545 Low back pain: Secondary | ICD-10-CM | POA: Diagnosis not present

## 2016-10-20 ENCOUNTER — Encounter (INDEPENDENT_AMBULATORY_CARE_PROVIDER_SITE_OTHER): Payer: Self-pay | Admitting: Physical Medicine and Rehabilitation

## 2016-10-20 ENCOUNTER — Ambulatory Visit (INDEPENDENT_AMBULATORY_CARE_PROVIDER_SITE_OTHER): Payer: Medicare Other | Admitting: Physical Medicine and Rehabilitation

## 2016-10-20 VITALS — BP 151/67 | HR 60

## 2016-10-20 DIAGNOSIS — M25552 Pain in left hip: Secondary | ICD-10-CM

## 2016-10-20 NOTE — Progress Notes (Signed)
   [name] - 75 y.o. female MRN [id number]  Date of birth: 03/21/1941  Office Visit Note: Visit Date: 10/20/2016 PCP: Pcp Not In System Referred by: Cammy Copaean, Scott Gregory, MD  Subjective: Chief Complaint  Patient presents with  . Left Hip - Pain   HPI: [name] is a 75 year old female left hip and groin pain with Increased left hip pain for 3 months. Radiates into left groin. Constant pain but pain level varies. Sharp stabbing pain at times. She has failed conservative care is followed by Dr. August Saucerean.    ROS Otherwise per HPI.  Assessment & Plan: Visit Diagnoses:  1. Pain in left hip     Plan: Findings:  Left anesthetic hip arthrogram diagnostically and hopefully therapeutic.    Meds & Orders: No orders of the defined types were placed in this encounter.   Orders Placed This Encounter  Procedures  . Large Joint Injection/Arthrocentesis    Follow-up: Return if symptoms worsen or fail to improve, for MRI review after completion.   Procedures: Hip anesthetic arthrogram Date/Time: 10/20/2016 3:07 PM Performed by: Tyrell AntonioNEWTON, Nicki Furlan Authorized by: Tyrell AntonioNEWTON, Savas Elvin   Consent Given by:  Patient Site marked: the procedure site was marked   Timeout: prior to procedure the correct patient, procedure, and site was verified   Indications:  Pain and diagnostic evaluation Location:  Hip Site:  L hip joint Prep: patient was prepped and draped in usual sterile fashion   Needle Size:  22 G Needle length (in): 5.0 inches. Approach:  Anterior Ultrasound Guidance: No   Fluoroscopic Guidance: No   Arthrogram: Yes   Medications:  4 mL lidocaine 2 %; 80 mg triamcinolone acetonide 40 MG/ML Aspiration Attempted: Yes   Patient tolerance:  Patient tolerated the procedure well with no immediate complications  Arthrogram demonstrated excellent flow of contrast throughout the joint surface without extravasation or obvious defect.  The patient had relief of symptoms during the anesthetic phase  of the injection.      No notes on file   Clinical History: No specialty comments available.  She reports that she has never smoked. She has never used smokeless tobacco. No results for input(s): HGBA1C, LABURIC in the last 8760 hours.  Objective:  VS:  HT:    WT:   BMI:     BP:(!) 151/67  HR:60bpm  TEMP: ( )  RESP:  Physical Exam  Musculoskeletal:  Patient ambulates with a cane. She has concordant pain with hip rotation.    Ortho Exam Imaging: No results found.  Past Medical/Family/Surgical/Social History: Medications & Allergies reviewed per EMR There are no active problems to display for this patient.  Past Medical History:  Diagnosis Date  . Depression   . Diabetes mellitus without complication (HCC)   . Hypercholesteremia   . Hypertension    No family history on file. Past Surgical History:  Procedure Laterality Date  . APPENDECTOMY    . BACK SURGERY  08/2016   ablation   . CHOLECYSTECTOMY    . TOTAL HIP ARTHROPLASTY Right   . TOTAL KNEE ARTHROPLASTY Right    Social History   Occupational History  . Not on file.   Social History Main Topics  . Smoking status: Never Smoker  . Smokeless tobacco: Never Used  . Alcohol use No  . Drug use: No  . Sexual activity: Not on file

## 2016-10-20 NOTE — Patient Instructions (Signed)

## 2016-10-22 MED ORDER — TRIAMCINOLONE ACETONIDE 40 MG/ML IJ SUSP
80.0000 mg | INTRAMUSCULAR | Status: AC | PRN
  Administered 2016-10-20: 80 mg via INTRA_ARTICULAR

## 2016-10-22 MED ORDER — LIDOCAINE HCL 2 % IJ SOLN
4.0000 mL | INTRAMUSCULAR | Status: AC | PRN
  Administered 2016-10-20: 4 mL

## 2016-10-24 ENCOUNTER — Ambulatory Visit (INDEPENDENT_AMBULATORY_CARE_PROVIDER_SITE_OTHER): Payer: Medicare Other | Admitting: Physical Medicine and Rehabilitation

## 2016-11-03 ENCOUNTER — Telehealth (INDEPENDENT_AMBULATORY_CARE_PROVIDER_SITE_OTHER): Payer: Self-pay | Admitting: Orthopedic Surgery

## 2016-11-03 NOTE — Telephone Encounter (Signed)
Pt called and wants to know if the mri she has scheduled on the 8th can include her hip as well as her back? Pt requested call back at [phone]

## 2016-11-04 ENCOUNTER — Ambulatory Visit: Payer: Medicare Other | Admitting: Gastroenterology

## 2016-11-08 NOTE — Telephone Encounter (Signed)
IC patient and LMVM that the MRI is of the lumbar spine, not hip, will not image the hip.  Also advised to call us with any further questions

## 2016-11-14 ENCOUNTER — Ambulatory Visit
Admission: RE | Admit: 2016-11-14 | Discharge: 2016-11-14 | Disposition: A | Discharge status: Home / Self Care | Payer: Medicare Other | Source: Ambulatory Visit | Attending: Orthopedic Surgery | Admitting: Orthopedic Surgery

## 2016-11-14 DIAGNOSIS — M4726 Other spondylosis with radiculopathy, lumbar region: Secondary | ICD-10-CM

## 2016-11-14 DIAGNOSIS — M48061 Spinal stenosis, lumbar region without neurogenic claudication: Secondary | ICD-10-CM | POA: Diagnosis not present

## 2016-11-15 DIAGNOSIS — M5417 Radiculopathy, lumbosacral region: Secondary | ICD-10-CM | POA: Diagnosis not present

## 2016-11-17 DIAGNOSIS — Z79899 Other long term (current) drug therapy: Secondary | ICD-10-CM | POA: Diagnosis not present

## 2016-11-17 DIAGNOSIS — M25559 Pain in unspecified hip: Secondary | ICD-10-CM | POA: Diagnosis not present

## 2016-11-17 DIAGNOSIS — Z79891 Long term (current) use of opiate analgesic: Secondary | ICD-10-CM | POA: Diagnosis not present

## 2016-11-17 DIAGNOSIS — G894 Chronic pain syndrome: Secondary | ICD-10-CM | POA: Diagnosis not present

## 2016-11-17 DIAGNOSIS — M545 Low back pain: Secondary | ICD-10-CM | POA: Diagnosis not present

## 2016-11-17 DIAGNOSIS — M5417 Radiculopathy, lumbosacral region: Secondary | ICD-10-CM | POA: Diagnosis not present

## 2016-11-21 DIAGNOSIS — E1165 Type 2 diabetes mellitus with hyperglycemia: Secondary | ICD-10-CM | POA: Diagnosis not present

## 2016-11-21 DIAGNOSIS — E041 Nontoxic single thyroid nodule: Secondary | ICD-10-CM | POA: Diagnosis not present

## 2016-11-21 DIAGNOSIS — Z23 Encounter for immunization: Secondary | ICD-10-CM | POA: Diagnosis not present

## 2016-11-21 DIAGNOSIS — I10 Essential (primary) hypertension: Secondary | ICD-10-CM | POA: Diagnosis not present

## 2016-11-21 DIAGNOSIS — E782 Mixed hyperlipidemia: Secondary | ICD-10-CM | POA: Diagnosis not present

## 2016-11-29 ENCOUNTER — Ambulatory Visit: Payer: Medicare Other | Admitting: Gastroenterology

## 2016-12-15 DIAGNOSIS — G894 Chronic pain syndrome: Secondary | ICD-10-CM | POA: Diagnosis not present

## 2016-12-15 DIAGNOSIS — M47816 Spondylosis without myelopathy or radiculopathy, lumbar region: Secondary | ICD-10-CM | POA: Diagnosis not present

## 2016-12-15 DIAGNOSIS — Z79899 Other long term (current) drug therapy: Secondary | ICD-10-CM | POA: Diagnosis not present

## 2016-12-15 DIAGNOSIS — M545 Low back pain: Secondary | ICD-10-CM | POA: Diagnosis not present

## 2016-12-15 DIAGNOSIS — M5417 Radiculopathy, lumbosacral region: Secondary | ICD-10-CM | POA: Diagnosis not present

## 2016-12-15 DIAGNOSIS — Z79891 Long term (current) use of opiate analgesic: Secondary | ICD-10-CM | POA: Diagnosis not present

## 2016-12-15 DIAGNOSIS — M25559 Pain in unspecified hip: Secondary | ICD-10-CM | POA: Diagnosis not present

## 2016-12-28 DIAGNOSIS — M79652 Pain in left thigh: Secondary | ICD-10-CM | POA: Diagnosis not present

## 2017-01-12 DIAGNOSIS — E1165 Type 2 diabetes mellitus with hyperglycemia: Secondary | ICD-10-CM | POA: Diagnosis not present

## 2017-01-12 DIAGNOSIS — Z79899 Other long term (current) drug therapy: Secondary | ICD-10-CM | POA: Diagnosis not present

## 2017-01-12 DIAGNOSIS — M5417 Radiculopathy, lumbosacral region: Secondary | ICD-10-CM | POA: Diagnosis not present

## 2017-01-12 DIAGNOSIS — M25559 Pain in unspecified hip: Secondary | ICD-10-CM | POA: Diagnosis not present

## 2017-01-12 DIAGNOSIS — G894 Chronic pain syndrome: Secondary | ICD-10-CM | POA: Diagnosis not present

## 2017-01-12 DIAGNOSIS — E782 Mixed hyperlipidemia: Secondary | ICD-10-CM | POA: Diagnosis not present

## 2017-01-12 DIAGNOSIS — M47816 Spondylosis without myelopathy or radiculopathy, lumbar region: Secondary | ICD-10-CM | POA: Diagnosis not present

## 2017-01-12 DIAGNOSIS — M79606 Pain in leg, unspecified: Secondary | ICD-10-CM | POA: Diagnosis not present

## 2017-01-12 DIAGNOSIS — M545 Low back pain: Secondary | ICD-10-CM | POA: Diagnosis not present

## 2017-01-12 DIAGNOSIS — Z79891 Long term (current) use of opiate analgesic: Secondary | ICD-10-CM | POA: Diagnosis not present

## 2017-01-23 DIAGNOSIS — E041 Nontoxic single thyroid nodule: Secondary | ICD-10-CM | POA: Diagnosis not present

## 2017-01-23 DIAGNOSIS — E1165 Type 2 diabetes mellitus with hyperglycemia: Secondary | ICD-10-CM | POA: Diagnosis not present

## 2017-01-23 DIAGNOSIS — R42 Dizziness and giddiness: Secondary | ICD-10-CM | POA: Diagnosis not present

## 2017-01-23 DIAGNOSIS — E782 Mixed hyperlipidemia: Secondary | ICD-10-CM | POA: Diagnosis not present

## 2017-01-24 ENCOUNTER — Other Ambulatory Visit: Payer: Self-pay | Admitting: Internal Medicine

## 2017-01-24 DIAGNOSIS — E041 Nontoxic single thyroid nodule: Secondary | ICD-10-CM

## 2017-02-09 DIAGNOSIS — M545 Low back pain: Secondary | ICD-10-CM | POA: Diagnosis not present

## 2017-02-09 DIAGNOSIS — Z79899 Other long term (current) drug therapy: Secondary | ICD-10-CM | POA: Diagnosis not present

## 2017-02-09 DIAGNOSIS — G894 Chronic pain syndrome: Secondary | ICD-10-CM | POA: Diagnosis not present

## 2017-02-09 DIAGNOSIS — Z79891 Long term (current) use of opiate analgesic: Secondary | ICD-10-CM | POA: Diagnosis not present

## 2017-02-09 DIAGNOSIS — M25559 Pain in unspecified hip: Secondary | ICD-10-CM | POA: Diagnosis not present

## 2017-02-09 DIAGNOSIS — M47816 Spondylosis without myelopathy or radiculopathy, lumbar region: Secondary | ICD-10-CM | POA: Diagnosis not present

## 2017-02-13 DIAGNOSIS — M25512 Pain in left shoulder: Secondary | ICD-10-CM | POA: Diagnosis not present

## 2017-02-13 DIAGNOSIS — W19XXXA Unspecified fall, initial encounter: Secondary | ICD-10-CM | POA: Diagnosis not present

## 2017-02-13 DIAGNOSIS — M25521 Pain in right elbow: Secondary | ICD-10-CM | POA: Diagnosis not present

## 2017-02-21 ENCOUNTER — Ambulatory Visit
Admission: RE | Admit: 2017-02-21 | Discharge: 2017-02-21 | Disposition: A | Discharge status: Home / Self Care | Payer: Medicare Other | Source: Ambulatory Visit | Attending: Internal Medicine | Admitting: Internal Medicine

## 2017-02-21 DIAGNOSIS — E041 Nontoxic single thyroid nodule: Secondary | ICD-10-CM

## 2017-02-24 ENCOUNTER — Other Ambulatory Visit: Payer: Self-pay | Admitting: Internal Medicine

## 2017-02-24 DIAGNOSIS — E041 Nontoxic single thyroid nodule: Secondary | ICD-10-CM

## 2017-03-13 DIAGNOSIS — M47816 Spondylosis without myelopathy or radiculopathy, lumbar region: Secondary | ICD-10-CM | POA: Diagnosis not present

## 2017-03-13 DIAGNOSIS — M25559 Pain in unspecified hip: Secondary | ICD-10-CM | POA: Diagnosis not present

## 2017-03-13 DIAGNOSIS — M47817 Spondylosis without myelopathy or radiculopathy, lumbosacral region: Secondary | ICD-10-CM | POA: Diagnosis not present

## 2017-03-13 DIAGNOSIS — M545 Low back pain: Secondary | ICD-10-CM | POA: Diagnosis not present

## 2017-03-13 DIAGNOSIS — Z79899 Other long term (current) drug therapy: Secondary | ICD-10-CM | POA: Diagnosis not present

## 2017-03-13 DIAGNOSIS — Z79891 Long term (current) use of opiate analgesic: Secondary | ICD-10-CM | POA: Diagnosis not present

## 2017-03-13 DIAGNOSIS — G894 Chronic pain syndrome: Secondary | ICD-10-CM | POA: Diagnosis not present

## 2017-03-16 ENCOUNTER — Ambulatory Visit
Admission: RE | Admit: 2017-03-16 | Discharge: 2017-03-16 | Disposition: A | Discharge status: Home / Self Care | Payer: Medicare Other | Source: Ambulatory Visit | Attending: Internal Medicine | Admitting: Internal Medicine

## 2017-03-16 ENCOUNTER — Other Ambulatory Visit (HOSPITAL_COMMUNITY)
Admission: RE | Admit: 2017-03-16 | Discharge: 2017-03-16 | Disposition: A | Discharge status: Home / Self Care | Payer: Medicare Other | Source: Ambulatory Visit | Attending: Radiology | Admitting: Radiology

## 2017-03-16 DIAGNOSIS — E041 Nontoxic single thyroid nodule: Secondary | ICD-10-CM

## 2017-03-16 DIAGNOSIS — E042 Nontoxic multinodular goiter: Secondary | ICD-10-CM | POA: Insufficient documentation

## 2017-03-22 ENCOUNTER — Encounter (HOSPITAL_COMMUNITY): Payer: Self-pay | Admitting: Emergency Medicine

## 2017-03-22 ENCOUNTER — Emergency Department (HOSPITAL_COMMUNITY)
Admission: EM | Admit: 2017-03-22 | Discharge: 2017-03-22 | Disposition: A | Discharge status: Home / Self Care | Payer: Medicare Other | Attending: Emergency Medicine | Admitting: Emergency Medicine

## 2017-03-22 DIAGNOSIS — I10 Essential (primary) hypertension: Secondary | ICD-10-CM | POA: Diagnosis not present

## 2017-03-22 DIAGNOSIS — Z96641 Presence of right artificial hip joint: Secondary | ICD-10-CM | POA: Insufficient documentation

## 2017-03-22 DIAGNOSIS — L509 Urticaria, unspecified: Secondary | ICD-10-CM

## 2017-03-22 DIAGNOSIS — Z7984 Long term (current) use of oral hypoglycemic drugs: Secondary | ICD-10-CM | POA: Insufficient documentation

## 2017-03-22 DIAGNOSIS — Z79899 Other long term (current) drug therapy: Secondary | ICD-10-CM | POA: Diagnosis not present

## 2017-03-22 DIAGNOSIS — E119 Type 2 diabetes mellitus without complications: Secondary | ICD-10-CM | POA: Insufficient documentation

## 2017-03-22 DIAGNOSIS — Z7982 Long term (current) use of aspirin: Secondary | ICD-10-CM | POA: Insufficient documentation

## 2017-03-22 DIAGNOSIS — L500 Allergic urticaria: Secondary | ICD-10-CM | POA: Diagnosis not present

## 2017-03-22 DIAGNOSIS — Z96651 Presence of right artificial knee joint: Secondary | ICD-10-CM | POA: Insufficient documentation

## 2017-03-22 DIAGNOSIS — R21 Rash and other nonspecific skin eruption: Secondary | ICD-10-CM | POA: Diagnosis present

## 2017-03-22 MED ORDER — HYDROXYZINE HCL 25 MG PO TABS: 25.0000 mg | ORAL_TABLET | Freq: Four times a day (QID) | ORAL | 0 refills | Status: AC | PRN

## 2017-03-22 MED ORDER — DIPHENHYDRAMINE HCL 25 MG PO CAPS
25.0000 mg | ORAL_CAPSULE | Freq: Once | ORAL | Status: AC
  Administered 2017-03-22: 25 mg via ORAL

## 2017-03-22 MED ORDER — HYDROXYZINE HCL 25 MG PO TABS
25.0000 mg | ORAL_TABLET | Freq: Once | ORAL | Status: AC
  Administered 2017-03-22: 25 mg via ORAL
  Filled 2017-03-22: qty 1

## 2017-03-22 MED ORDER — FAMOTIDINE 20 MG PO TABS
20.0000 mg | ORAL_TABLET | Freq: Once | ORAL | Status: AC
  Administered 2017-03-22: 20 mg via ORAL
  Filled 2017-03-22: qty 1

## 2017-03-22 MED ORDER — DIPHENHYDRAMINE HCL 25 MG PO CAPS
25.0000 mg | ORAL_CAPSULE | Freq: Once | ORAL | Status: AC
  Administered 2017-03-22: 25 mg via ORAL
  Filled 2017-03-22: qty 1

## 2017-03-22 MED ORDER — FAMOTIDINE 20 MG PO TABS: 20.0000 mg | ORAL_TABLET | Freq: Every day | ORAL | 0 refills | Status: AC

## 2017-03-22 MED ORDER — PREDNISONE 20 MG PO TABS
60.0000 mg | ORAL_TABLET | Freq: Once | ORAL | Status: AC
  Administered 2017-03-22: 60 mg via ORAL
  Filled 2017-03-22 (×2): qty 3

## 2017-03-22 MED ORDER — DIPHENHYDRAMINE HCL 25 MG PO CAPS
ORAL_CAPSULE | ORAL | Status: AC
  Filled 2017-03-22: qty 1

## 2017-03-22 MED ORDER — PREDNISONE 20 MG PO TABS: 40.0000 mg | ORAL_TABLET | Freq: Every day | ORAL | 0 refills | Status: AC

## 2017-03-22 MED ORDER — EPINEPHRINE 0.3 MG/0.3ML IJ SOAJ: 0.3000 mg | Freq: Once | INTRAMUSCULAR | 0 refills | Status: AC

## 2017-03-22 NOTE — Discharge Instructions (Signed)
You were seen today for hives. The cause of your reaction is unknown. Continue prednisone, Pepcid at home daily for the next several days. Atarax as needed for itching. He will also be given a prescription for an EpiPen. Only use this if you have new onset shortness of breath, throat swelling, nausea, vomiting or any other signs or symptoms of anaphylaxis.

## 2017-03-22 NOTE — ED Triage Notes (Signed)
Pt presents with hives to L breast, back, and groin area; pt also c/o rash to folds of skin; pt reports using new lotion and soap after relocating

## 2017-03-22 NOTE — ED Provider Notes (Signed)
[facility] Provider Note   CSN: [id number] Arrival date & time: 03/22/17  0009  By signing my name below, I, Phillips Climes, attest that this documentation has been prepared under the direction and in the presence of No att. providers found . Electronically Signed: Phillips Climes, Scribe. 03/22/2017. 6:24 AM.   History   Chief Complaint Chief Complaint  Patient presents with  . Urticaria  . Rash  . Allergic Reaction   Renee Stewart is a 76 y.o. female with a PMHx of depression, DM2, HTN and HCL, who presents to the Emergency Department with complaints of a sudden onset generalized, itching, erythematous rash x1 day, worse on her breasts, groin and buttocks. Pt admits to eating at a buffet earlier today, in addition to using a new hand soap and lotion, but reports there were several hours between those exposures and the onset of the rash. Additionally, she endorses an increase in her normal leg swelling, after traveling from [place] several days ago.  Pt denies experiencing any other acute sx, including fever, shortness of breath, nausea, vomiting, chest pain, chest tightness or trouble swallowing. She has no known food allergies.   The history is provided by the patient. No language interpreter was used.    Past Medical History:  Diagnosis Date  . Depression   . Diabetes mellitus without complication (HCC)   . Hypercholesteremia   . Hypertension     There are no active problems to display for this patient.   Past Surgical History:  Procedure Laterality Date  . APPENDECTOMY    . BACK SURGERY  08/2016   ablation   . CHOLECYSTECTOMY    . TOTAL HIP ARTHROPLASTY Right   . TOTAL KNEE ARTHROPLASTY Right     OB History    No data available       Home Medications    Prior to Admission medications   Medication Sig Start Date End Date Taking? Authorizing Provider  aspirin EC 81 MG tablet Take 81 mg by mouth daily.   Yes [provider]  BIOTIN PO Take 1  tablet by mouth daily.   Yes [provider]  cholecalciferol (VITAMIN D) 1000 units tablet Take 1,000 Units by mouth daily.   Yes [provider]  HYDROcodone-acetaminophen (NORCO) 7.5-325 MG tablet Take 1 tablet by mouth 4 (four) times daily.   Yes [provider]  lisinopril-hydrochlorothiazide (PRINZIDE,ZESTORETIC) 10-12.5 MG tablet Take 0.5 tablets by mouth daily.   Yes [provider]  lovastatin (MEVACOR) 20 MG tablet Take 40 mg by mouth at bedtime.   Yes [provider]  metFORMIN (GLUCOPHAGE) 1000 MG tablet Take 1,000 mg by mouth 2 (two) times daily with a meal.   Yes [provider]  metoprolol tartrate (LOPRESSOR) 25 MG tablet Take 12.5 mg by mouth 2 (two) times daily.   Yes [provider]  ondansetron (ZOFRAN) 4 MG tablet Take 1 tablet (4 mg total) by mouth every 8 (eight) hours as needed for nausea or vomiting. 09/10/16  Yes Dione Booze, MD  oxyCODONE-acetaminophen (PERCOCET/ROXICET) 5-325 MG tablet Take 1-2 tablets by mouth every 4 (four) hours as needed for severe pain.   Yes [provider]  sertraline (ZOLOFT) 100 MG tablet Take 100 mg by mouth daily.   Yes [provider]  EPINEPHrine 0.3 mg/0.3 mL IJ SOAJ injection Inject 0.3 mLs (0.3 mg total) into the muscle once. Any signs or symptoms of anaphylaxis 03/22/17 03/22/17  Ladoris Lythgoe, Mayer Masker, MD  famotidine (PEPCID)  20 MG tablet Take 1 tablet (20 mg total) by mouth daily. 03/22/17   Azarria Balint, Mayer Maskerourtney F, MD  hydrOXYzine (ATARAX/VISTARIL) 25 MG tablet Take 1 tablet (25 mg total) by mouth every 6 (six) hours as needed for itching. 03/22/17   Maylin Freeburg, Mayer Maskerourtney F, MD  predniSONE (DELTASONE) 20 MG tablet Take 2 tablets (40 mg total) by mouth daily. 03/22/17   Leveta Wahab, Mayer Maskerourtney F, MD    Family History History reviewed. No pertinent family history.  Social History Social History  Substance Use Topics  . Smoking status: Never Smoker  . Smokeless tobacco: Never  Used  . Alcohol use No     Allergies   Patient has no known allergies.   Review of Systems Review of Systems  Constitutional: Negative for fever.  HENT: Negative for trouble swallowing.   Respiratory: Negative for chest tightness and shortness of breath.   Cardiovascular: Positive for leg swelling. Negative for chest pain.  Gastrointestinal: Negative for nausea and vomiting.  Skin: Positive for rash.  All other systems reviewed and are negative.   Physical Exam Updated Vital Signs BP (!) 186/92 (BP Location: Right Wrist)   Pulse 75   Temp 97.9 F (36.6 C) (Oral)   Resp 18   Ht 5\' 4"  (1.626 m)   Wt 215 lb (97.5 kg)   SpO2 96%   BMI 36.90 kg/m   Physical Exam  Constitutional: She is oriented to person, place, and time. No distress.  Elderly, overweight  HENT:  Head: Normocephalic and atraumatic.  Mouth/Throat: Oropharynx is clear and moist.  No swelling  Cardiovascular: Normal rate, regular rhythm and normal heart sounds.   Pulmonary/Chest: Effort normal and breath sounds normal. No respiratory distress. She has no wheezes.  Abdominal: Soft. There is no tenderness.  Musculoskeletal: She exhibits edema.  Neurological: She is alert and oriented to person, place, and time.  Skin: Skin is warm and dry.  Psychiatric: She has a normal mood and affect.  Nursing note and vitals reviewed.    ED Treatments / Results  COORDINATION OF CARE: 2:55 AM Discussed treatment plan with pt at bedside and pt agreed to plan.  3:50 AM Sx with minimal improvement. Continues to experience itchy rash and hives. Will give Atarax and continue to monitor.   Labs (all labs ordered are listed, but only abnormal results are displayed) Labs Reviewed - No data to display  EKG  EKG Interpretation None       Radiology No results found.  Procedures Procedures (including critical care time)  Medications Ordered in ED Medications  diphenhydrAMINE (BENADRYL) capsule 25 mg (25 mg  Oral Given 03/22/17 0033)  famotidine (PEPCID) tablet 20 mg (20 mg Oral Given 03/22/17 0256)  diphenhydrAMINE (BENADRYL) capsule 25 mg (25 mg Oral Given 03/22/17 0258)  predniSONE (DELTASONE) tablet 60 mg (60 mg Oral Given 03/22/17 0301)  hydrOXYzine (ATARAX/VISTARIL) tablet 25 mg (25 mg Oral Given 03/22/17 0359)     Initial Impression / Assessment and Plan / ED Course  I have reviewed the triage vital signs and the nursing notes.  Pertinent labs & imaging results that were available during my care of the patient were reviewed by me and considered in my medical decision making (see chart for details).     Patient presents with generalized hives. She is nontoxic. No signs or symptoms of anaphylaxis. Initially given Benadryl, Pepcid, and prednisone. On recheck, she had persistent hives. She was given Atarax additionally for itching. On final recheck, rash has defervesced and patient reports  improvement. Unclear etiology of hives. Reexamination is reassuring. Continue Pepcid, Atarax, and prednisone at home. An EpiPen was provided as well and patient was given precautions regarding anaphylaxis.  After history, exam, and medical workup I feel the patient has been appropriately medically screened and is safe for discharge home. Pertinent diagnoses were discussed with the patient. Patient was given return precautions.   Final Clinical Impressions(s) / ED Diagnoses   Final diagnoses:  Hives    New Prescriptions Discharge Medication List as of 03/22/2017  5:45 AM    START taking these medications   Details  EPINEPHrine 0.3 mg/0.3 mL IJ SOAJ injection Inject 0.3 mLs (0.3 mg total) into the muscle once. Any signs or symptoms of anaphylaxis, Starting Wed 03/22/2017, Print    famotidine (PEPCID) 20 MG tablet Take 1 tablet (20 mg total) by mouth daily., Starting Wed 03/22/2017, Print    hydrOXYzine (ATARAX/VISTARIL) 25 MG tablet Take 1 tablet (25 mg total) by mouth every 6 (six) hours as needed for  itching., Starting Wed 03/22/2017, Print    predniSONE (DELTASONE) 20 MG tablet Take 2 tablets (40 mg total) by mouth daily., Starting Wed 03/22/2017, Print       I personally performed the services described in this documentation, which was scribed in my presence. The recorded information has been reviewed and is accurate.     Shon Baton, MD 03/22/17 [phone]

## 2017-04-06 DIAGNOSIS — M169 Osteoarthritis of hip, unspecified: Secondary | ICD-10-CM | POA: Diagnosis not present

## 2017-04-17 DIAGNOSIS — Z79891 Long term (current) use of opiate analgesic: Secondary | ICD-10-CM | POA: Diagnosis not present

## 2017-04-17 DIAGNOSIS — M545 Low back pain: Secondary | ICD-10-CM | POA: Diagnosis not present

## 2017-04-17 DIAGNOSIS — G894 Chronic pain syndrome: Secondary | ICD-10-CM | POA: Diagnosis not present

## 2017-04-17 DIAGNOSIS — M47816 Spondylosis without myelopathy or radiculopathy, lumbar region: Secondary | ICD-10-CM | POA: Diagnosis not present

## 2017-04-17 DIAGNOSIS — M25559 Pain in unspecified hip: Secondary | ICD-10-CM | POA: Diagnosis not present

## 2017-04-17 DIAGNOSIS — M47817 Spondylosis without myelopathy or radiculopathy, lumbosacral region: Secondary | ICD-10-CM | POA: Diagnosis not present

## 2017-04-17 DIAGNOSIS — Z79899 Other long term (current) drug therapy: Secondary | ICD-10-CM | POA: Diagnosis not present

## 2017-05-27 DIAGNOSIS — Z79891 Long term (current) use of opiate analgesic: Secondary | ICD-10-CM | POA: Diagnosis not present

## 2017-05-27 DIAGNOSIS — M5416 Radiculopathy, lumbar region: Secondary | ICD-10-CM | POA: Diagnosis not present

## 2017-05-30 ENCOUNTER — Ambulatory Visit (INDEPENDENT_AMBULATORY_CARE_PROVIDER_SITE_OTHER): Payer: Medicare Other | Admitting: Orthopedic Surgery

## 2017-05-30 ENCOUNTER — Telehealth (INDEPENDENT_AMBULATORY_CARE_PROVIDER_SITE_OTHER): Payer: Self-pay

## 2017-05-30 DIAGNOSIS — M25552 Pain in left hip: Secondary | ICD-10-CM | POA: Diagnosis not present

## 2017-05-30 NOTE — Telephone Encounter (Signed)
Per Dr August Saucerean, please schedule appt with Dr Ophelia CharterYates for possible decompression

## 2017-06-01 ENCOUNTER — Encounter (INDEPENDENT_AMBULATORY_CARE_PROVIDER_SITE_OTHER): Payer: Self-pay | Admitting: Orthopedic Surgery

## 2017-06-01 NOTE — Progress Notes (Signed)
Office Visit Note   Patient: Renee Stewart           Date of Birth: 07/21/1941           MRN: [id number] Visit Date: 05/30/2017 Requested by: Renee Stewart, Ajith, MD [address] [place], [place] [id number] PCP: Renee Stewart, Ajith, MD  Subjective: Chief Complaint  Patient presents with  . Lower Back - Pain    HPI: Renee Stewart is a 76 year old patient with low back pain.  She has seen in the pain management physician who wants to do an ablation on her back.  He is in [place].  She's had radiographs of her lumbar spine from January 2018.  She is here to make sure that the orthopedic doctor thinks that that is "the right choice".  She's taking Norco for her symptoms.  She reports that the pain wakes her up at night.  His worse in the morning.  Describes spasms and numbness in the left lower chemise in the lateral thigh.  She did have a history of having good results with these ablations which typically lasts for a year and year and a half.  That worked until the last ablation which was done with a different physician in October 2017.  This did not work.  She has since seen a desire [facility] at [facility] [place].  He is recommending another ablation.  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  Fairly minimal spondylolisthesis.              ROS: All systems reviewed are negative as they relate to the chief complaint within the history of present illness.  Patient denies  fevers or chills.   Assessment & Plan: Visit Diagnoses:  1. Pain in left hip     Plan: Impression is back pain with spinal stenosis and facet arthritis with previously good results from RF ablation.  Last RF ablation did not work.  Don't know if that was due to the technical aspect of the procedure or if there is been progression of disease.  She also is having some left hip symptoms.  Radiographs are fairly unremarkable but she could have  occult arthritis in that left hip based on exam.  Patient also has diminished pedal pulses.  At this time she needs ABI bilateral lower extremities to evaluate for neurogenic claudication.  I also want to obtain an MRI scan of the pelvis and left hip to evaluate for occult arthritis.  Last component would be referral to Dr. Ophelia Stewart for possible decompression and fusion of her lumbar spine.  Seems to have only 1 level affected and may be a good candidate for that procedure.  I'll see her back as needed my impression is that it is the facet arthritis that is giving her most of her symptoms based on her history as well as positive response to the ablation.  Follow-Up Instructions: No Follow-up on file.   Orders:  Orders Placed This Encounter  Procedures  . MR Hip Left w/o contrast   No orders of the defined types were placed in this encounter.     Procedures: No procedures performed   Clinical Data: No additional findings.  Objective: Vital Signs: There were no vitals taken for this visit.  Physical Exam:   Constitutional: Patient appears well-developed HEENT:  Head: Normocephalic Eyes:EOM are normal Neck: Normal range of motion Cardiovascular: Normal rate Pulmonary/chest: Effort normal Neurologic:  Patient is alert Skin: Skin is warm Psychiatric: Patient has normal mood and affect    Ortho Exam: Orthopedic exam demonstrates normal gait alignment diminished pedal pulses but warm feet soft compartments.  5 out of 5 ankle dorsi and plantar flexion quite hamstring strength.  Does have pain with extension and to a lesser degree of flexion.  No trochanteric tenderness is noted.  Does have pain with left hip range of motion particularly internal rotation.  Right hip has fairly painless range of motion.  Good hip flexion abduction and adduction strength.  Mild paresthesias on the left L5 distribution compared to the right.  Negative Babinski negative clonus.  Specialty Comments:  No  specialty comments available.  Imaging: No results found.   PMFS History: There are no active problems to display for this patient.  Past Medical History:  Diagnosis Date  . Depression   . Diabetes mellitus without complication (HCC)   . Hypercholesteremia   . Hypertension     No family history on file.  Past Surgical History:  Procedure Laterality Date  . APPENDECTOMY    . BACK SURGERY  08/2016   ablation   . CHOLECYSTECTOMY    . TOTAL HIP ARTHROPLASTY Right   . TOTAL KNEE ARTHROPLASTY Right    Social History   Occupational History  . Not on file.   Social History Main Topics  . Smoking status: Never Smoker  . Smokeless tobacco: Never Used  . Alcohol use No  . Drug use: No  . Sexual activity: Not on file

## 2017-06-11 ENCOUNTER — Ambulatory Visit
Admission: RE | Admit: 2017-06-11 | Discharge: 2017-06-11 | Disposition: A | Discharge status: Home / Self Care | Payer: Medicare Other | Source: Ambulatory Visit | Attending: Orthopedic Surgery | Admitting: Orthopedic Surgery

## 2017-06-11 DIAGNOSIS — M25552 Pain in left hip: Secondary | ICD-10-CM

## 2017-06-11 DIAGNOSIS — M1612 Unilateral primary osteoarthritis, left hip: Secondary | ICD-10-CM | POA: Diagnosis not present

## 2017-06-16 DIAGNOSIS — M47816 Spondylosis without myelopathy or radiculopathy, lumbar region: Secondary | ICD-10-CM | POA: Diagnosis not present

## 2017-06-19 DIAGNOSIS — Z780 Asymptomatic menopausal state: Secondary | ICD-10-CM | POA: Diagnosis not present

## 2017-06-19 DIAGNOSIS — M8580 Other specified disorders of bone density and structure, unspecified site: Secondary | ICD-10-CM | POA: Diagnosis not present

## 2017-06-19 DIAGNOSIS — M4306 Spondylolysis, lumbar region: Secondary | ICD-10-CM | POA: Diagnosis not present

## 2017-06-19 DIAGNOSIS — Z0000 Encounter for general adult medical examination without abnormal findings: Secondary | ICD-10-CM | POA: Diagnosis not present

## 2017-06-19 DIAGNOSIS — E782 Mixed hyperlipidemia: Secondary | ICD-10-CM | POA: Diagnosis not present

## 2017-06-19 DIAGNOSIS — M461 Sacroiliitis, not elsewhere classified: Secondary | ICD-10-CM | POA: Diagnosis not present

## 2017-06-19 DIAGNOSIS — E041 Nontoxic single thyroid nodule: Secondary | ICD-10-CM | POA: Diagnosis not present

## 2017-06-19 DIAGNOSIS — N390 Urinary tract infection, site not specified: Secondary | ICD-10-CM | POA: Diagnosis not present

## 2017-06-19 DIAGNOSIS — E1165 Type 2 diabetes mellitus with hyperglycemia: Secondary | ICD-10-CM | POA: Diagnosis not present

## 2017-06-26 DIAGNOSIS — E1165 Type 2 diabetes mellitus with hyperglycemia: Secondary | ICD-10-CM | POA: Diagnosis not present

## 2017-06-26 DIAGNOSIS — I10 Essential (primary) hypertension: Secondary | ICD-10-CM | POA: Diagnosis not present

## 2017-06-26 DIAGNOSIS — E782 Mixed hyperlipidemia: Secondary | ICD-10-CM | POA: Diagnosis not present

## 2017-06-26 DIAGNOSIS — R002 Palpitations: Secondary | ICD-10-CM | POA: Diagnosis not present

## 2017-06-30 DIAGNOSIS — M47816 Spondylosis without myelopathy or radiculopathy, lumbar region: Secondary | ICD-10-CM | POA: Diagnosis not present

## 2017-07-19 DIAGNOSIS — M461 Sacroiliitis, not elsewhere classified: Secondary | ICD-10-CM | POA: Diagnosis not present

## 2017-07-19 DIAGNOSIS — M4306 Spondylolysis, lumbar region: Secondary | ICD-10-CM | POA: Diagnosis not present

## 2017-08-14 DIAGNOSIS — M5416 Radiculopathy, lumbar region: Secondary | ICD-10-CM | POA: Diagnosis not present

## 2017-08-14 DIAGNOSIS — M5116 Intervertebral disc disorders with radiculopathy, lumbar region: Secondary | ICD-10-CM | POA: Diagnosis not present

## 2017-08-14 DIAGNOSIS — M545 Low back pain: Secondary | ICD-10-CM | POA: Diagnosis not present

## 2017-08-15 DIAGNOSIS — Z79891 Long term (current) use of opiate analgesic: Secondary | ICD-10-CM | POA: Diagnosis not present

## 2017-08-15 DIAGNOSIS — M4306 Spondylolysis, lumbar region: Secondary | ICD-10-CM | POA: Diagnosis not present

## 2017-08-21 DIAGNOSIS — M4317 Spondylolisthesis, lumbosacral region: Secondary | ICD-10-CM | POA: Diagnosis not present

## 2017-08-24 ENCOUNTER — Other Ambulatory Visit: Payer: Self-pay

## 2017-08-24 NOTE — Patient Outreach (Signed)
[facility] Care Management  08/24/2017  Renee Stewart 12/04/1940 [id number]    Medication Adherence call to Renee Stewart the reason for this call is because Renee Stewart is showing past due under [employer].on Lovastatin 40 mg spoke to patient she said she [facility] three month supply patient wont be due until December.  Renee AbedAna Stewart CPhT Pharmacy Technician [facility] Care Management Direct Dial [phone]  Fax [phone] [email or web address]

## 2017-09-05 DIAGNOSIS — M4306 Spondylolysis, lumbar region: Secondary | ICD-10-CM | POA: Diagnosis not present

## 2017-09-05 DIAGNOSIS — Z79891 Long term (current) use of opiate analgesic: Secondary | ICD-10-CM | POA: Diagnosis not present

## 2017-09-19 DIAGNOSIS — Z01818 Encounter for other preprocedural examination: Secondary | ICD-10-CM | POA: Diagnosis not present

## 2017-09-19 DIAGNOSIS — M4316 Spondylolisthesis, lumbar region: Secondary | ICD-10-CM | POA: Diagnosis not present

## 2017-09-19 DIAGNOSIS — M48062 Spinal stenosis, lumbar region with neurogenic claudication: Secondary | ICD-10-CM | POA: Diagnosis not present

## 2017-09-27 DIAGNOSIS — E785 Hyperlipidemia, unspecified: Secondary | ICD-10-CM | POA: Diagnosis not present

## 2017-09-27 DIAGNOSIS — Z881 Allergy status to other antibiotic agents status: Secondary | ICD-10-CM | POA: Diagnosis not present

## 2017-09-27 DIAGNOSIS — Z96651 Presence of right artificial knee joint: Secondary | ICD-10-CM | POA: Diagnosis not present

## 2017-09-27 DIAGNOSIS — E039 Hypothyroidism, unspecified: Secondary | ICD-10-CM | POA: Diagnosis not present

## 2017-09-27 DIAGNOSIS — Z79891 Long term (current) use of opiate analgesic: Secondary | ICD-10-CM | POA: Diagnosis not present

## 2017-09-27 DIAGNOSIS — Z7952 Long term (current) use of systemic steroids: Secondary | ICD-10-CM | POA: Diagnosis not present

## 2017-09-27 DIAGNOSIS — I10 Essential (primary) hypertension: Secondary | ICD-10-CM | POA: Diagnosis not present

## 2017-09-27 DIAGNOSIS — M48062 Spinal stenosis, lumbar region with neurogenic claudication: Secondary | ICD-10-CM | POA: Diagnosis not present

## 2017-09-27 DIAGNOSIS — Z7984 Long term (current) use of oral hypoglycemic drugs: Secondary | ICD-10-CM | POA: Diagnosis not present

## 2017-09-27 DIAGNOSIS — Z9849 Cataract extraction status, unspecified eye: Secondary | ICD-10-CM | POA: Diagnosis not present

## 2017-09-27 DIAGNOSIS — Z7409 Other reduced mobility: Secondary | ICD-10-CM | POA: Diagnosis not present

## 2017-09-27 DIAGNOSIS — M4327 Fusion of spine, lumbosacral region: Secondary | ICD-10-CM | POA: Diagnosis not present

## 2017-09-27 DIAGNOSIS — Z79899 Other long term (current) drug therapy: Secondary | ICD-10-CM | POA: Diagnosis not present

## 2017-09-27 DIAGNOSIS — Z91048 Other nonmedicinal substance allergy status: Secondary | ICD-10-CM | POA: Diagnosis not present

## 2017-09-27 DIAGNOSIS — M545 Low back pain: Secondary | ICD-10-CM | POA: Diagnosis not present

## 2017-09-27 DIAGNOSIS — M4316 Spondylolisthesis, lumbar region: Secondary | ICD-10-CM | POA: Diagnosis not present

## 2017-09-27 DIAGNOSIS — Z96641 Presence of right artificial hip joint: Secondary | ICD-10-CM | POA: Diagnosis not present

## 2017-09-27 DIAGNOSIS — E876 Hypokalemia: Secondary | ICD-10-CM | POA: Diagnosis not present

## 2017-09-27 DIAGNOSIS — E890 Postprocedural hypothyroidism: Secondary | ICD-10-CM | POA: Diagnosis not present

## 2017-09-27 DIAGNOSIS — E1165 Type 2 diabetes mellitus with hyperglycemia: Secondary | ICD-10-CM | POA: Diagnosis not present

## 2017-09-27 DIAGNOSIS — Z961 Presence of intraocular lens: Secondary | ICD-10-CM | POA: Diagnosis not present

## 2017-09-27 DIAGNOSIS — K5900 Constipation, unspecified: Secondary | ICD-10-CM | POA: Diagnosis not present

## 2017-09-27 DIAGNOSIS — Z4889 Encounter for other specified surgical aftercare: Secondary | ICD-10-CM | POA: Diagnosis not present

## 2017-09-27 DIAGNOSIS — Z7982 Long term (current) use of aspirin: Secondary | ICD-10-CM | POA: Diagnosis not present

## 2017-09-27 DIAGNOSIS — K219 Gastro-esophageal reflux disease without esophagitis: Secondary | ICD-10-CM | POA: Diagnosis not present

## 2017-09-27 DIAGNOSIS — M6281 Muscle weakness (generalized): Secondary | ICD-10-CM | POA: Diagnosis not present

## 2017-09-27 DIAGNOSIS — Z981 Arthrodesis status: Secondary | ICD-10-CM | POA: Diagnosis not present

## 2017-09-27 DIAGNOSIS — E119 Type 2 diabetes mellitus without complications: Secondary | ICD-10-CM | POA: Diagnosis not present

## 2017-09-27 DIAGNOSIS — E118 Type 2 diabetes mellitus with unspecified complications: Secondary | ICD-10-CM | POA: Diagnosis not present

## 2017-09-27 DIAGNOSIS — R262 Difficulty in walking, not elsewhere classified: Secondary | ICD-10-CM | POA: Diagnosis not present

## 2017-09-27 DIAGNOSIS — Z9049 Acquired absence of other specified parts of digestive tract: Secondary | ICD-10-CM | POA: Diagnosis not present

## 2017-10-02 DIAGNOSIS — Z7409 Other reduced mobility: Secondary | ICD-10-CM | POA: Diagnosis not present

## 2017-10-02 DIAGNOSIS — M6281 Muscle weakness (generalized): Secondary | ICD-10-CM | POA: Diagnosis not present

## 2017-10-02 DIAGNOSIS — K5900 Constipation, unspecified: Secondary | ICD-10-CM | POA: Diagnosis not present

## 2017-10-02 DIAGNOSIS — M48062 Spinal stenosis, lumbar region with neurogenic claudication: Secondary | ICD-10-CM | POA: Diagnosis not present

## 2017-10-02 DIAGNOSIS — M4327 Fusion of spine, lumbosacral region: Secondary | ICD-10-CM | POA: Diagnosis not present

## 2017-10-02 DIAGNOSIS — I10 Essential (primary) hypertension: Secondary | ICD-10-CM | POA: Diagnosis not present

## 2017-10-02 DIAGNOSIS — E118 Type 2 diabetes mellitus with unspecified complications: Secondary | ICD-10-CM | POA: Diagnosis not present

## 2017-10-02 DIAGNOSIS — R262 Difficulty in walking, not elsewhere classified: Secondary | ICD-10-CM | POA: Diagnosis not present

## 2017-10-02 DIAGNOSIS — E119 Type 2 diabetes mellitus without complications: Secondary | ICD-10-CM | POA: Diagnosis not present

## 2017-10-02 DIAGNOSIS — Z981 Arthrodesis status: Secondary | ICD-10-CM | POA: Diagnosis not present

## 2017-10-02 DIAGNOSIS — M4316 Spondylolisthesis, lumbar region: Secondary | ICD-10-CM | POA: Diagnosis not present

## 2017-10-02 DIAGNOSIS — M545 Low back pain: Secondary | ICD-10-CM | POA: Diagnosis not present

## 2017-10-02 DIAGNOSIS — Z4889 Encounter for other specified surgical aftercare: Secondary | ICD-10-CM | POA: Diagnosis not present

## 2017-10-02 DIAGNOSIS — E039 Hypothyroidism, unspecified: Secondary | ICD-10-CM | POA: Diagnosis not present

## 2017-10-06 DIAGNOSIS — Z981 Arthrodesis status: Secondary | ICD-10-CM | POA: Diagnosis not present

## 2017-10-06 DIAGNOSIS — K5900 Constipation, unspecified: Secondary | ICD-10-CM | POA: Diagnosis not present

## 2017-10-06 DIAGNOSIS — E118 Type 2 diabetes mellitus with unspecified complications: Secondary | ICD-10-CM | POA: Diagnosis not present

## 2017-10-06 DIAGNOSIS — I10 Essential (primary) hypertension: Secondary | ICD-10-CM | POA: Diagnosis not present

## 2017-10-06 DIAGNOSIS — Z7409 Other reduced mobility: Secondary | ICD-10-CM | POA: Diagnosis not present

## 2017-10-11 DIAGNOSIS — K5900 Constipation, unspecified: Secondary | ICD-10-CM | POA: Diagnosis not present

## 2017-10-11 DIAGNOSIS — E118 Type 2 diabetes mellitus with unspecified complications: Secondary | ICD-10-CM | POA: Diagnosis not present

## 2017-10-11 DIAGNOSIS — Z981 Arthrodesis status: Secondary | ICD-10-CM | POA: Diagnosis not present

## 2017-10-11 DIAGNOSIS — Z7409 Other reduced mobility: Secondary | ICD-10-CM | POA: Diagnosis not present

## 2017-10-11 DIAGNOSIS — I10 Essential (primary) hypertension: Secondary | ICD-10-CM | POA: Diagnosis not present

## 2017-10-13 DIAGNOSIS — E118 Type 2 diabetes mellitus with unspecified complications: Secondary | ICD-10-CM | POA: Diagnosis not present

## 2017-10-13 DIAGNOSIS — Z981 Arthrodesis status: Secondary | ICD-10-CM | POA: Diagnosis not present

## 2017-10-13 DIAGNOSIS — I10 Essential (primary) hypertension: Secondary | ICD-10-CM | POA: Diagnosis not present

## 2017-10-13 DIAGNOSIS — K5900 Constipation, unspecified: Secondary | ICD-10-CM | POA: Diagnosis not present

## 2017-10-13 DIAGNOSIS — Z7409 Other reduced mobility: Secondary | ICD-10-CM | POA: Diagnosis not present

## 2017-10-16 DIAGNOSIS — E118 Type 2 diabetes mellitus with unspecified complications: Secondary | ICD-10-CM | POA: Diagnosis not present

## 2017-10-16 DIAGNOSIS — Z981 Arthrodesis status: Secondary | ICD-10-CM | POA: Diagnosis not present

## 2017-10-16 DIAGNOSIS — Z7409 Other reduced mobility: Secondary | ICD-10-CM | POA: Diagnosis not present

## 2017-10-16 DIAGNOSIS — K5900 Constipation, unspecified: Secondary | ICD-10-CM | POA: Diagnosis not present

## 2017-10-16 DIAGNOSIS — I10 Essential (primary) hypertension: Secondary | ICD-10-CM | POA: Diagnosis not present

## 2017-10-19 DIAGNOSIS — Z981 Arthrodesis status: Secondary | ICD-10-CM | POA: Diagnosis not present

## 2017-10-20 DIAGNOSIS — Z7982 Long term (current) use of aspirin: Secondary | ICD-10-CM | POA: Diagnosis not present

## 2017-10-20 DIAGNOSIS — E119 Type 2 diabetes mellitus without complications: Secondary | ICD-10-CM | POA: Diagnosis not present

## 2017-10-20 DIAGNOSIS — M48061 Spinal stenosis, lumbar region without neurogenic claudication: Secondary | ICD-10-CM | POA: Diagnosis not present

## 2017-10-20 DIAGNOSIS — I10 Essential (primary) hypertension: Secondary | ICD-10-CM | POA: Diagnosis not present

## 2017-10-20 DIAGNOSIS — Z4789 Encounter for other orthopedic aftercare: Secondary | ICD-10-CM | POA: Diagnosis not present

## 2017-10-23 DIAGNOSIS — M48061 Spinal stenosis, lumbar region without neurogenic claudication: Secondary | ICD-10-CM | POA: Diagnosis not present

## 2017-10-23 DIAGNOSIS — Z7982 Long term (current) use of aspirin: Secondary | ICD-10-CM | POA: Diagnosis not present

## 2017-10-23 DIAGNOSIS — E119 Type 2 diabetes mellitus without complications: Secondary | ICD-10-CM | POA: Diagnosis not present

## 2017-10-23 DIAGNOSIS — Z4789 Encounter for other orthopedic aftercare: Secondary | ICD-10-CM | POA: Diagnosis not present

## 2017-10-23 DIAGNOSIS — I10 Essential (primary) hypertension: Secondary | ICD-10-CM | POA: Diagnosis not present

## 2017-10-26 DIAGNOSIS — E119 Type 2 diabetes mellitus without complications: Secondary | ICD-10-CM | POA: Diagnosis not present

## 2017-10-26 DIAGNOSIS — Z7982 Long term (current) use of aspirin: Secondary | ICD-10-CM | POA: Diagnosis not present

## 2017-10-26 DIAGNOSIS — Z4789 Encounter for other orthopedic aftercare: Secondary | ICD-10-CM | POA: Diagnosis not present

## 2017-10-26 DIAGNOSIS — M48061 Spinal stenosis, lumbar region without neurogenic claudication: Secondary | ICD-10-CM | POA: Diagnosis not present

## 2017-10-26 DIAGNOSIS — I10 Essential (primary) hypertension: Secondary | ICD-10-CM | POA: Diagnosis not present

## 2017-10-27 DIAGNOSIS — Z7982 Long term (current) use of aspirin: Secondary | ICD-10-CM | POA: Diagnosis not present

## 2017-10-27 DIAGNOSIS — Z4789 Encounter for other orthopedic aftercare: Secondary | ICD-10-CM | POA: Diagnosis not present

## 2017-10-27 DIAGNOSIS — E119 Type 2 diabetes mellitus without complications: Secondary | ICD-10-CM | POA: Diagnosis not present

## 2017-10-27 DIAGNOSIS — M48061 Spinal stenosis, lumbar region without neurogenic claudication: Secondary | ICD-10-CM | POA: Diagnosis not present

## 2017-10-27 DIAGNOSIS — I10 Essential (primary) hypertension: Secondary | ICD-10-CM | POA: Diagnosis not present

## 2017-11-08 DIAGNOSIS — Z4789 Encounter for other orthopedic aftercare: Secondary | ICD-10-CM | POA: Diagnosis not present

## 2017-11-08 DIAGNOSIS — I10 Essential (primary) hypertension: Secondary | ICD-10-CM | POA: Diagnosis not present

## 2017-11-08 DIAGNOSIS — E119 Type 2 diabetes mellitus without complications: Secondary | ICD-10-CM | POA: Diagnosis not present

## 2017-11-08 DIAGNOSIS — Z7982 Long term (current) use of aspirin: Secondary | ICD-10-CM | POA: Diagnosis not present

## 2017-11-08 DIAGNOSIS — M48061 Spinal stenosis, lumbar region without neurogenic claudication: Secondary | ICD-10-CM | POA: Diagnosis not present

## 2017-11-09 DIAGNOSIS — Z7982 Long term (current) use of aspirin: Secondary | ICD-10-CM | POA: Diagnosis not present

## 2017-11-09 DIAGNOSIS — I10 Essential (primary) hypertension: Secondary | ICD-10-CM | POA: Diagnosis not present

## 2017-11-09 DIAGNOSIS — N390 Urinary tract infection, site not specified: Secondary | ICD-10-CM | POA: Diagnosis not present

## 2017-11-09 DIAGNOSIS — R300 Dysuria: Secondary | ICD-10-CM | POA: Diagnosis not present

## 2017-11-09 DIAGNOSIS — E119 Type 2 diabetes mellitus without complications: Secondary | ICD-10-CM | POA: Diagnosis not present

## 2017-11-09 DIAGNOSIS — M48061 Spinal stenosis, lumbar region without neurogenic claudication: Secondary | ICD-10-CM | POA: Diagnosis not present

## 2017-11-09 DIAGNOSIS — Z4789 Encounter for other orthopedic aftercare: Secondary | ICD-10-CM | POA: Diagnosis not present

## 2017-11-13 DIAGNOSIS — E119 Type 2 diabetes mellitus without complications: Secondary | ICD-10-CM | POA: Diagnosis not present

## 2017-11-14 DIAGNOSIS — Z4789 Encounter for other orthopedic aftercare: Secondary | ICD-10-CM | POA: Diagnosis not present

## 2017-11-14 DIAGNOSIS — E119 Type 2 diabetes mellitus without complications: Secondary | ICD-10-CM | POA: Diagnosis not present

## 2017-11-14 DIAGNOSIS — M48061 Spinal stenosis, lumbar region without neurogenic claudication: Secondary | ICD-10-CM | POA: Diagnosis not present

## 2017-11-14 DIAGNOSIS — I10 Essential (primary) hypertension: Secondary | ICD-10-CM | POA: Diagnosis not present

## 2017-11-14 DIAGNOSIS — Z7982 Long term (current) use of aspirin: Secondary | ICD-10-CM | POA: Diagnosis not present

## 2017-11-14 DIAGNOSIS — H8110 Benign paroxysmal vertigo, unspecified ear: Secondary | ICD-10-CM | POA: Diagnosis not present

## 2017-11-17 DIAGNOSIS — Z4789 Encounter for other orthopedic aftercare: Secondary | ICD-10-CM | POA: Diagnosis not present

## 2017-11-17 DIAGNOSIS — E119 Type 2 diabetes mellitus without complications: Secondary | ICD-10-CM | POA: Diagnosis not present

## 2017-11-17 DIAGNOSIS — I10 Essential (primary) hypertension: Secondary | ICD-10-CM | POA: Diagnosis not present

## 2017-11-17 DIAGNOSIS — M48061 Spinal stenosis, lumbar region without neurogenic claudication: Secondary | ICD-10-CM | POA: Diagnosis not present

## 2017-11-17 DIAGNOSIS — Z7982 Long term (current) use of aspirin: Secondary | ICD-10-CM | POA: Diagnosis not present

## 2017-11-20 DIAGNOSIS — E1165 Type 2 diabetes mellitus with hyperglycemia: Secondary | ICD-10-CM | POA: Diagnosis not present

## 2017-11-20 DIAGNOSIS — E782 Mixed hyperlipidemia: Secondary | ICD-10-CM | POA: Diagnosis not present

## 2017-11-27 DIAGNOSIS — I10 Essential (primary) hypertension: Secondary | ICD-10-CM | POA: Diagnosis not present

## 2017-11-27 DIAGNOSIS — E1165 Type 2 diabetes mellitus with hyperglycemia: Secondary | ICD-10-CM | POA: Diagnosis not present

## 2017-11-27 DIAGNOSIS — E782 Mixed hyperlipidemia: Secondary | ICD-10-CM | POA: Diagnosis not present

## 2017-11-27 DIAGNOSIS — M4326 Fusion of spine, lumbar region: Secondary | ICD-10-CM | POA: Diagnosis not present

## 2017-11-27 DIAGNOSIS — M461 Sacroiliitis, not elsewhere classified: Secondary | ICD-10-CM | POA: Diagnosis not present

## 2017-12-11 IMAGING — RF DG UGI W/ KUB
10 of 15 series · 13 of 24 positions shown · non-contrast
Comparison: None.

CLINICAL DATA: Nausea and vomiting in the morning. Foods getting
stuck in throat. Diabetes. Hypertension.

EXAM:
UPPER GI SERIES WITH KUB
TECHNIQUE: After obtaining a scout radiograph a routine upper GI series was
performed using thin barium.
FLUOROSCOPY TIME:  Fluoroscopy Time:  3 minutes and 54 seconds
Number of Acquired Spot Images: 0

[Series 1: t abdomen supine · 0.15mm/px · 1 of 1 slices shown]
[im 1/1]
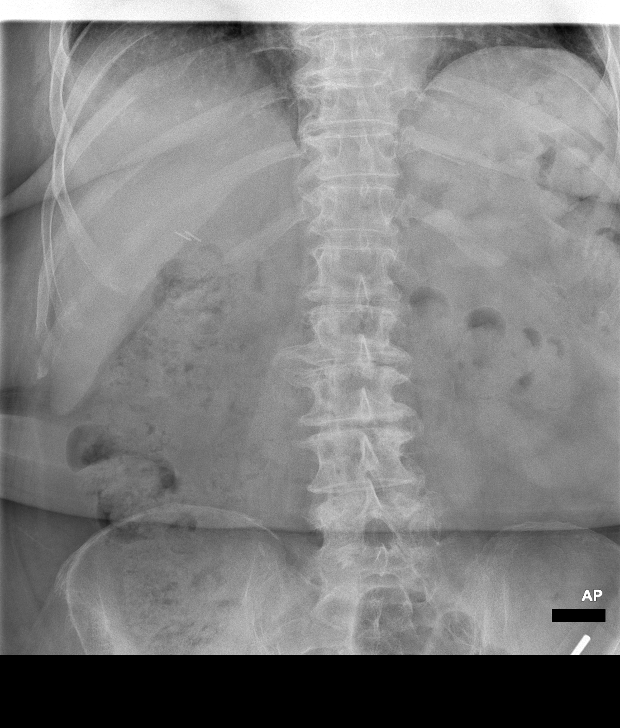

[Series 2: cp_standard · 0.26mm/px · 1 of 2 slices shown (1 of 9)]
[im 2/2]
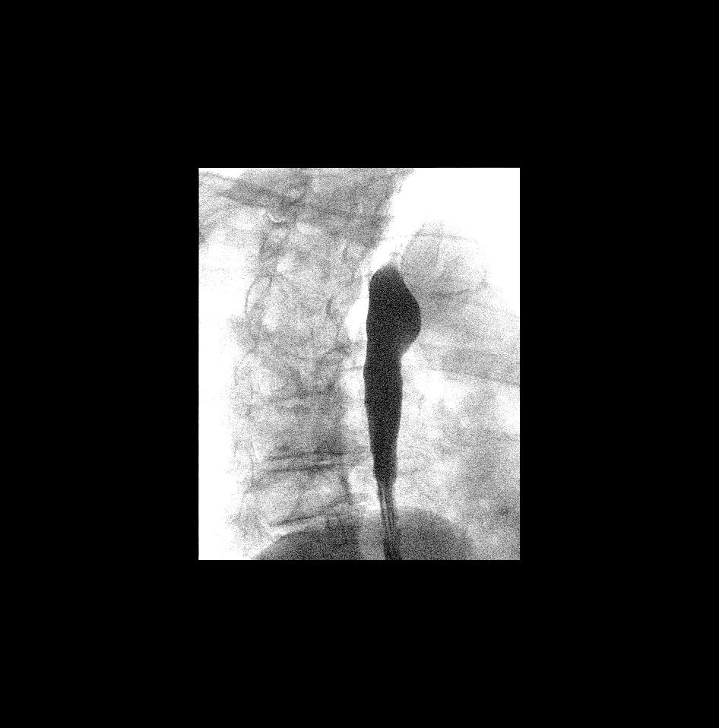

[Series 4: cp_standard · 0.52mm/px · 2 of 321 frames shown (2 of 9)]
[frame 161/321]
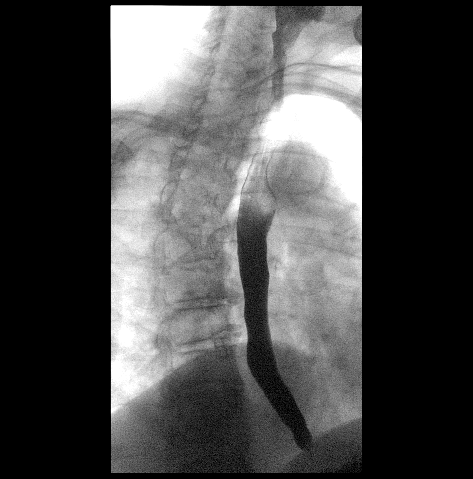
[frame 273/321]
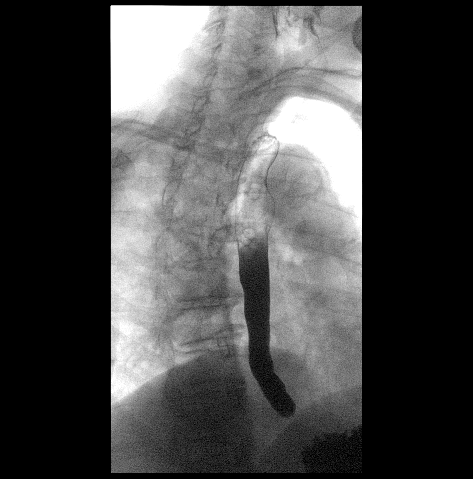

[Series 5: cp_standard · 0.52mm/px · 2 of 184 frames shown (3 of 9)]
[frame 28/184]
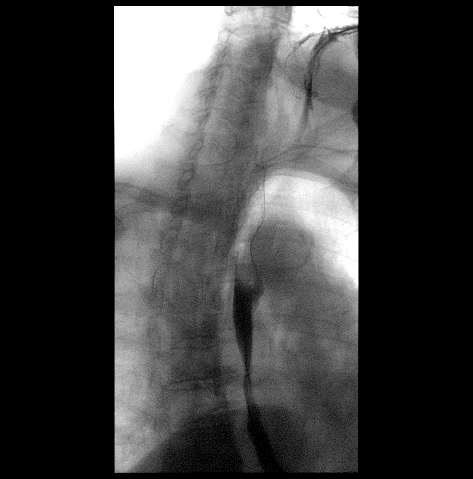
[frame 157/184]
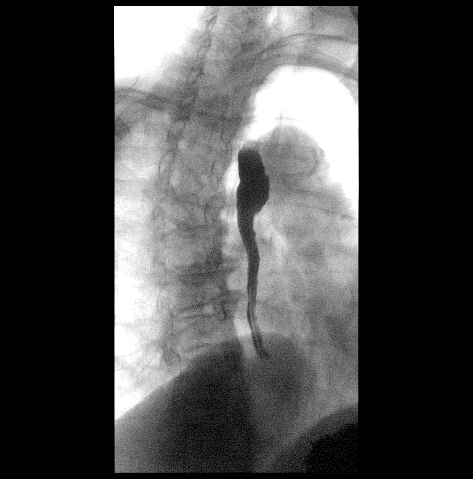

[Series 6: cp_standard · 0.35mm/px · 2 of 163 frames shown (4 of 9)]
[frame 139/163]
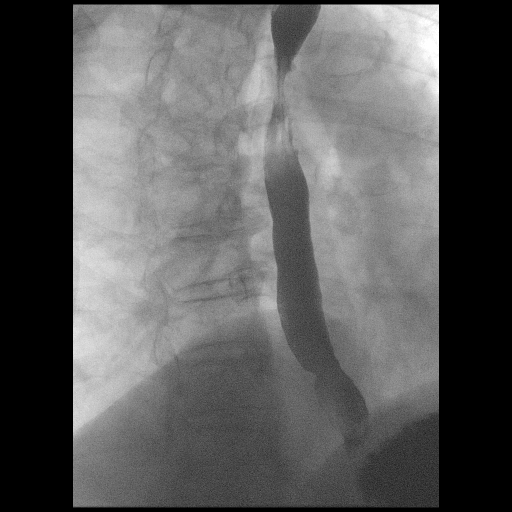
[frame 163/163]
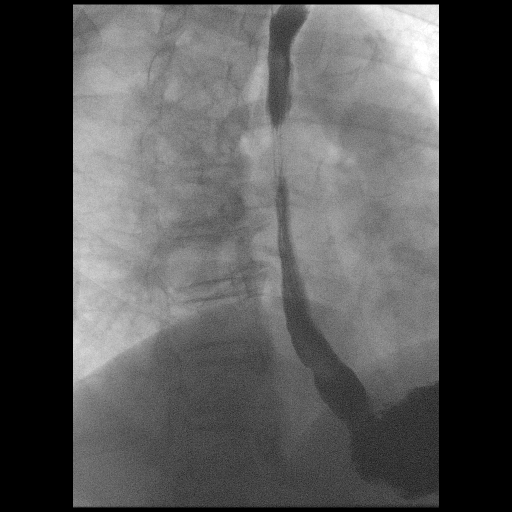

[Series 8: cp_standard · 0.18mm/px · 1 of 1 slices shown (5 of 9)]
[im 1/1]
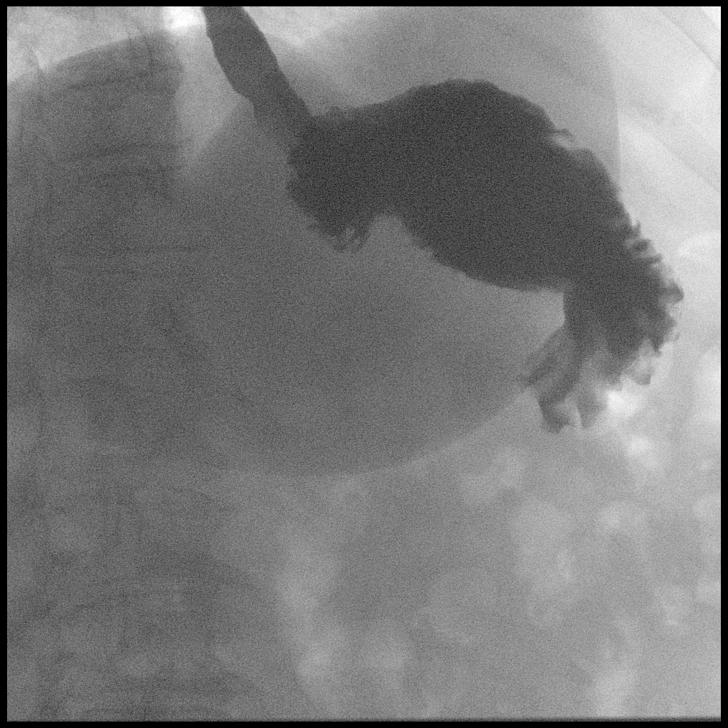

[Series 10: cp_standard · 0.18mm/px · 1 of 1 slices shown (6 of 9)]
[im 1/1]
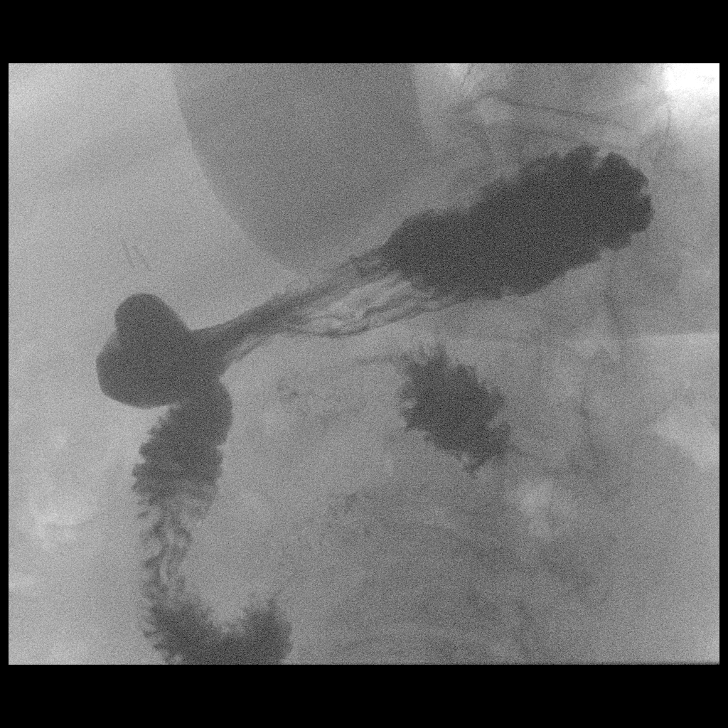

[Series 12: cp_standard · 0.18mm/px · 1 of 1 slices shown (7 of 9)]
[im 1/1]
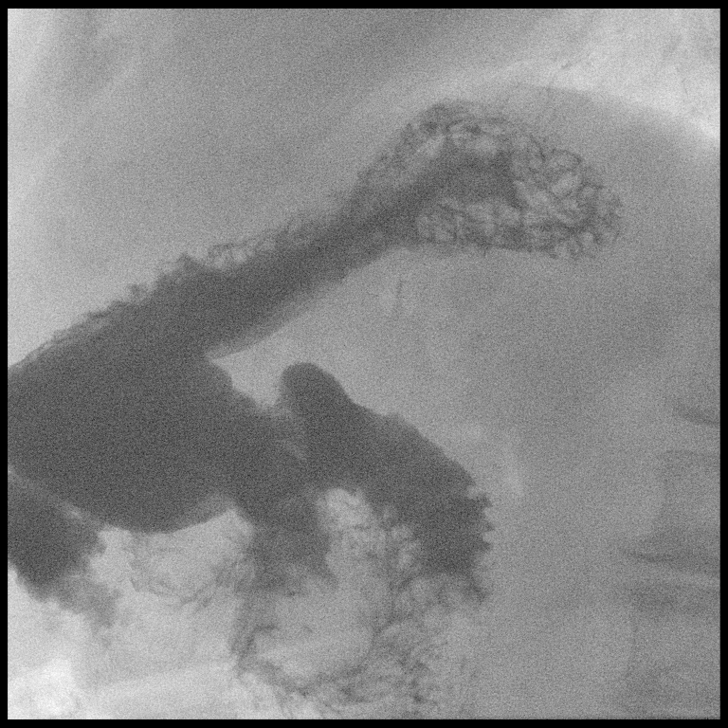

[Series 14: cp_standard · 0.18mm/px · 1 of 1 slices shown (8 of 9)]
[im 1/1]
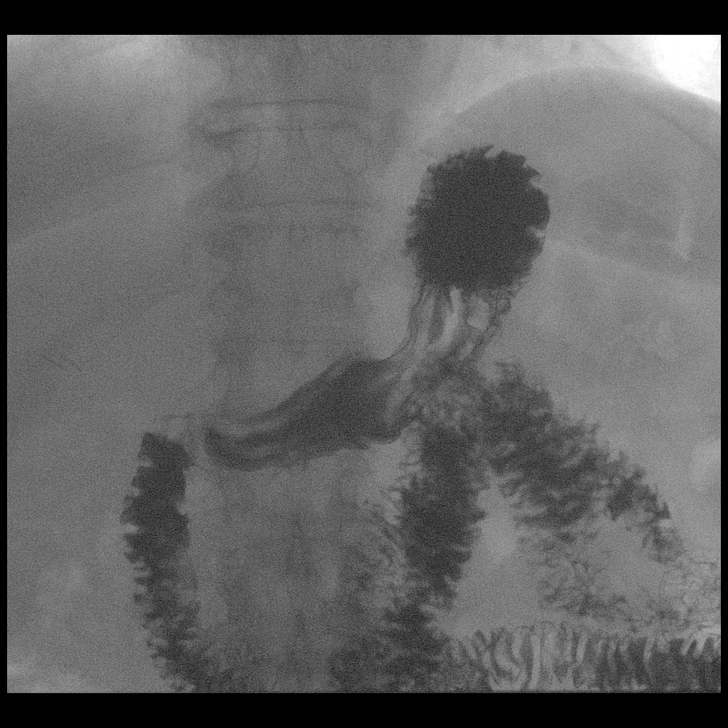

[Series 16: cp_standard · 0.28mm/px · 1 of 1 slices shown (9 of 9)]
[im 1/1]
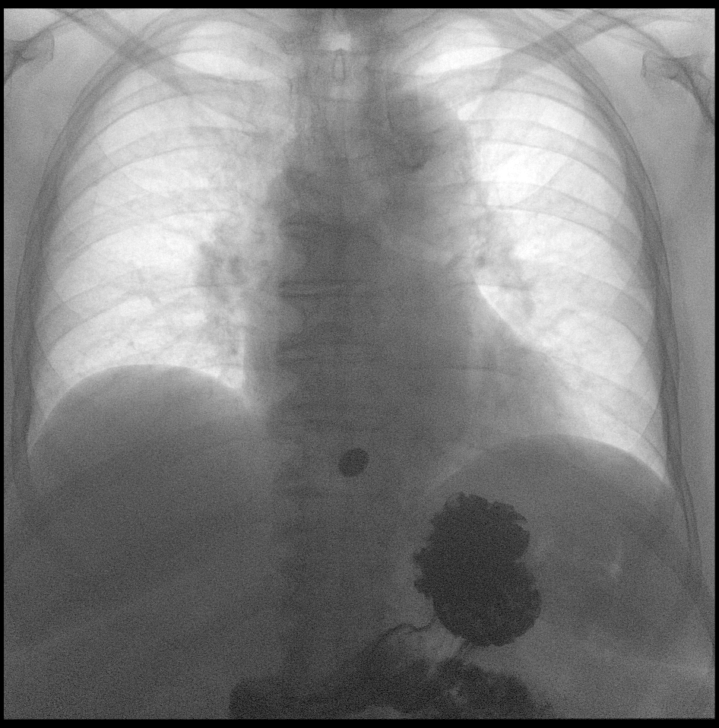

[13 of 24 positions shown; findings below may reference images not displayed]

FINDINGS: Preprocedure scout film demonstrates convex right lumbar spine
curvature. Cholecystectomy. Large colonic stool burden.

Exam was performed with the patient supine and in various posterior
obliquities. Upright and anterior obliquities could not be
performed.

Evaluation of primary esophageal peristalsis demonstrates an
incomplete wave with contrast stasis in the mid thoracic esophagus.

Full column evaluation of the esophagus demonstrates no persistent
narrowing or stricture.

Single-contrast evaluation of the stomach demonstrates no hernia,
mass, ulcer, or obstruction. Prompt passage of contrast into the
duodenal bulb and C-loop, which are normal in appearance.

Delayed passage of a 13 mm barium tablet in the mid and lower
esophagus.
IMPRESSION: 1. Limited exam secondary to patient immobility, as detailed above.
2. Esophageal dysmotility, likely presbyesophagus. This is
mild-to-moderate.
3. Delayed passage of a 13 mm barium tablet, likely secondary to
dysmotility.

## 2017-12-18 DIAGNOSIS — M7061 Trochanteric bursitis, right hip: Secondary | ICD-10-CM | POA: Diagnosis not present

## 2017-12-18 DIAGNOSIS — M4316 Spondylolisthesis, lumbar region: Secondary | ICD-10-CM | POA: Diagnosis not present

## 2017-12-25 DIAGNOSIS — M4326 Fusion of spine, lumbar region: Secondary | ICD-10-CM | POA: Diagnosis not present

## 2017-12-25 DIAGNOSIS — M461 Sacroiliitis, not elsewhere classified: Secondary | ICD-10-CM | POA: Diagnosis not present

## 2018-01-20 DIAGNOSIS — S0990XA Unspecified injury of head, initial encounter: Secondary | ICD-10-CM | POA: Diagnosis not present

## 2018-01-20 DIAGNOSIS — S99921A Unspecified injury of right foot, initial encounter: Secondary | ICD-10-CM | POA: Diagnosis not present

## 2018-01-20 DIAGNOSIS — S80912A Unspecified superficial injury of left knee, initial encounter: Secondary | ICD-10-CM | POA: Diagnosis not present

## 2018-01-20 DIAGNOSIS — R51 Headache: Secondary | ICD-10-CM | POA: Diagnosis not present

## 2018-01-20 DIAGNOSIS — S90811A Abrasion, right foot, initial encounter: Secondary | ICD-10-CM | POA: Diagnosis not present

## 2018-01-20 DIAGNOSIS — M542 Cervicalgia: Secondary | ICD-10-CM | POA: Diagnosis not present

## 2018-01-20 DIAGNOSIS — S0081XA Abrasion of other part of head, initial encounter: Secondary | ICD-10-CM | POA: Diagnosis not present

## 2018-01-20 DIAGNOSIS — S60212A Contusion of left wrist, initial encounter: Secondary | ICD-10-CM | POA: Diagnosis not present

## 2018-01-20 DIAGNOSIS — S90511A Abrasion, right ankle, initial encounter: Secondary | ICD-10-CM | POA: Diagnosis not present

## 2018-01-20 DIAGNOSIS — S0083XA Contusion of other part of head, initial encounter: Secondary | ICD-10-CM | POA: Diagnosis not present

## 2018-01-20 DIAGNOSIS — S8002XA Contusion of left knee, initial encounter: Secondary | ICD-10-CM | POA: Diagnosis not present

## 2018-01-20 DIAGNOSIS — S9031XA Contusion of right foot, initial encounter: Secondary | ICD-10-CM | POA: Diagnosis not present

## 2018-01-20 DIAGNOSIS — S098XXA Other specified injuries of head, initial encounter: Secondary | ICD-10-CM | POA: Diagnosis not present

## 2018-01-20 DIAGNOSIS — S9001XA Contusion of right ankle, initial encounter: Secondary | ICD-10-CM | POA: Diagnosis not present

## 2018-01-20 DIAGNOSIS — S99911A Unspecified injury of right ankle, initial encounter: Secondary | ICD-10-CM | POA: Diagnosis not present

## 2018-01-25 DIAGNOSIS — R350 Frequency of micturition: Secondary | ICD-10-CM | POA: Diagnosis not present

## 2018-01-25 DIAGNOSIS — E119 Type 2 diabetes mellitus without complications: Secondary | ICD-10-CM | POA: Diagnosis not present

## 2018-01-25 DIAGNOSIS — S93421S Sprain of deltoid ligament of right ankle, sequela: Secondary | ICD-10-CM | POA: Diagnosis not present

## 2018-02-09 DIAGNOSIS — M25551 Pain in right hip: Secondary | ICD-10-CM | POA: Diagnosis not present

## 2018-02-09 DIAGNOSIS — S3992XA Unspecified injury of lower back, initial encounter: Secondary | ICD-10-CM | POA: Diagnosis not present

## 2018-02-09 DIAGNOSIS — M549 Dorsalgia, unspecified: Secondary | ICD-10-CM | POA: Diagnosis not present

## 2018-02-22 DIAGNOSIS — M7060 Trochanteric bursitis, unspecified hip: Secondary | ICD-10-CM | POA: Diagnosis not present

## 2018-03-12 DIAGNOSIS — M7061 Trochanteric bursitis, right hip: Secondary | ICD-10-CM | POA: Diagnosis not present

## 2018-03-12 DIAGNOSIS — Z981 Arthrodesis status: Secondary | ICD-10-CM | POA: Diagnosis not present

## 2018-03-23 DIAGNOSIS — M25551 Pain in right hip: Secondary | ICD-10-CM | POA: Diagnosis not present

## 2018-03-23 DIAGNOSIS — M7061 Trochanteric bursitis, right hip: Secondary | ICD-10-CM | POA: Diagnosis not present

## 2018-05-14 IMAGING — US US THYROID BIOPSY
1 series · 11 of 11 positions shown · non-contrast
Comparison: Thyroid ultrasound performed 02/22/2017

MEDICATIONS:
None

COMPLICATIONS:
None immediate.

INDICATION: Indeterminate left thyroid nodule. Prior history of right thyroid
lobectomy secondary to cancer

EXAM:
ULTRASOUND GUIDED FINE NEEDLE ASPIRATION OF INDETERMINATE LEFT
THYROID NODULE
TECHNIQUE: Informed written consent was obtained from the patient after a
discussion of the risks, benefits and alternatives to treatment.
Questions regarding the procedure were encouraged and answered. A
timeout was performed prior to the initiation of the procedure.

[Series 1: us thyroid biopsy · 0.06mm/px · 11 acquisitions, 11 frames shown]
[im 1/11]
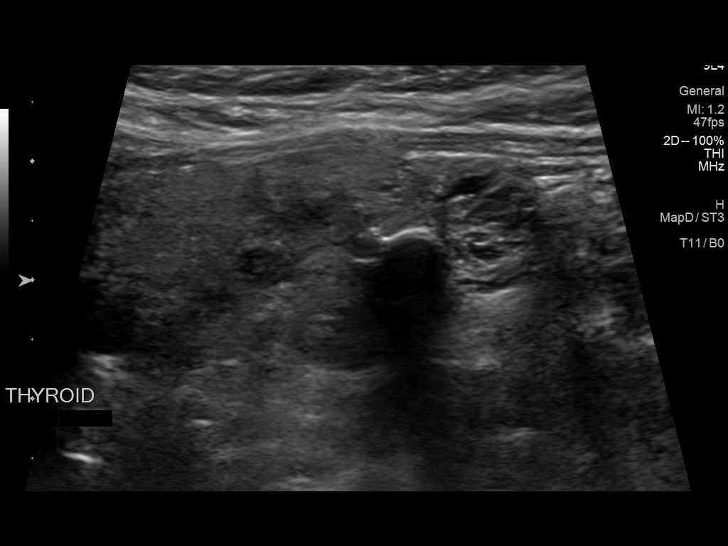
[im 2/11]
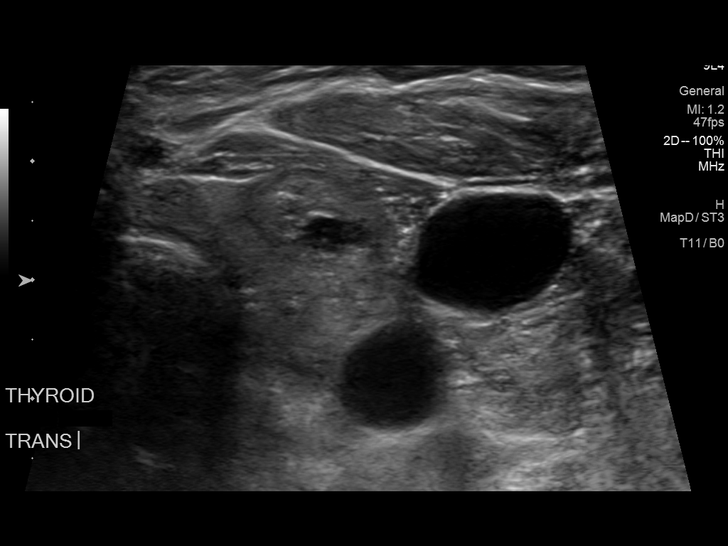
[im 3/11]
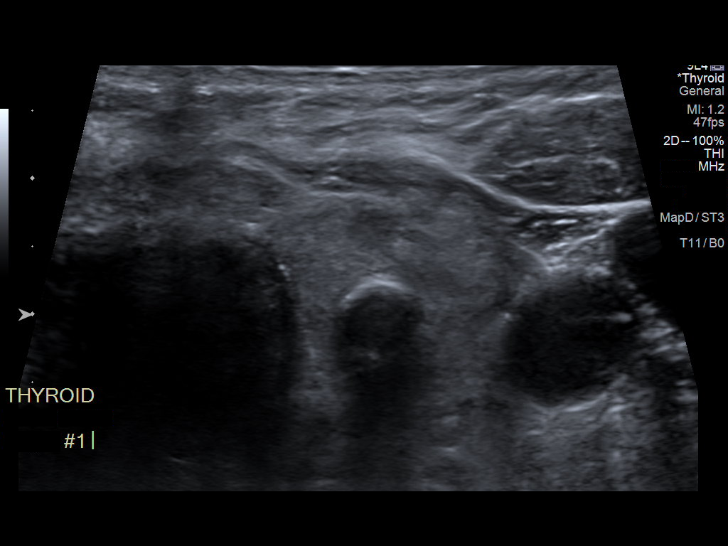
[im 4/11]
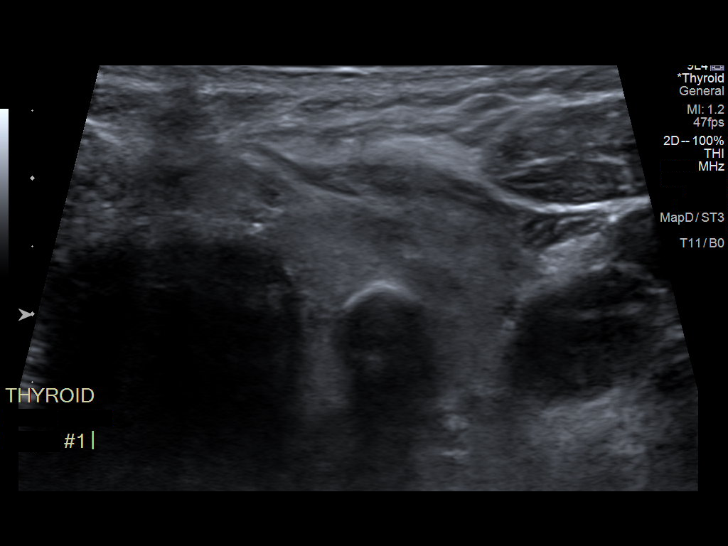
[im 5/11]
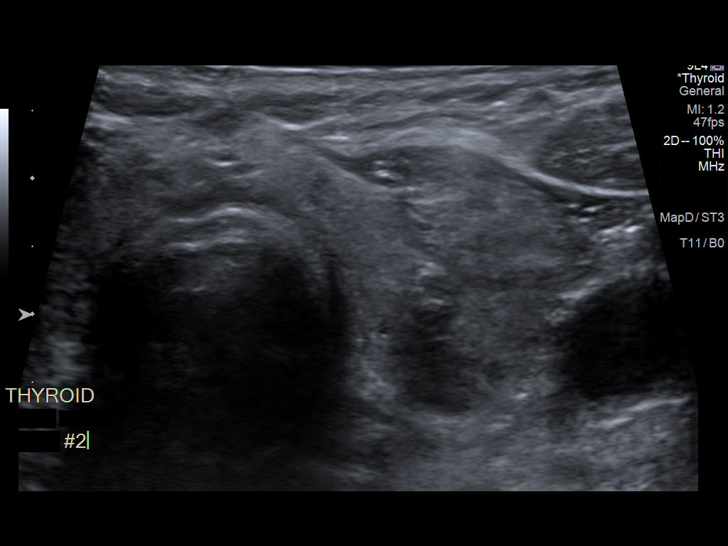
[im 6/11]
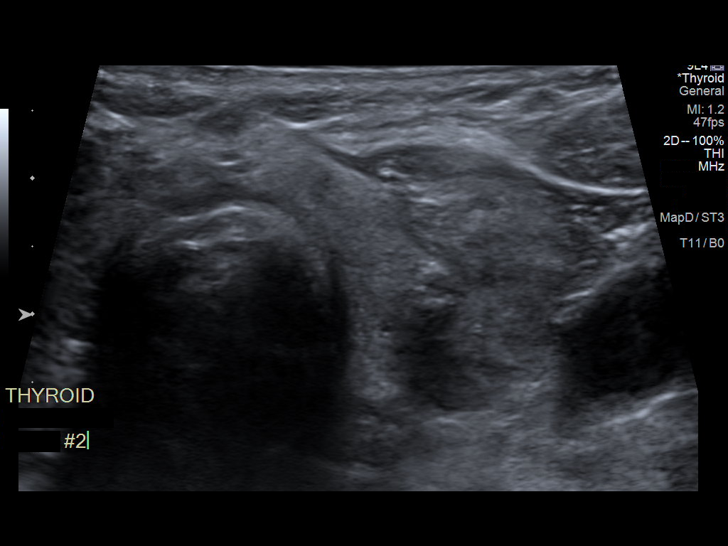
[im 7/11]
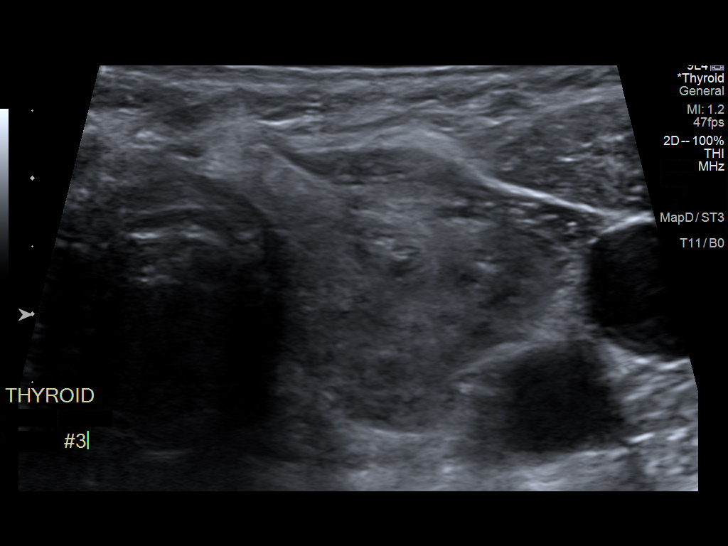
[im 8/11]
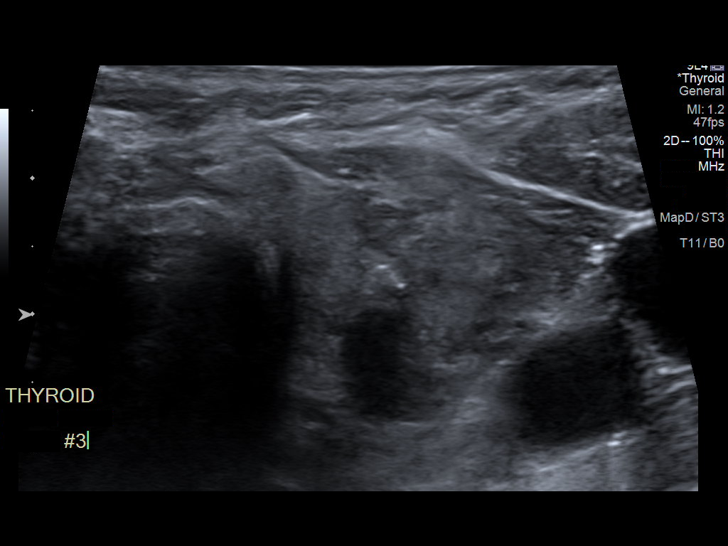
[im 9/11]
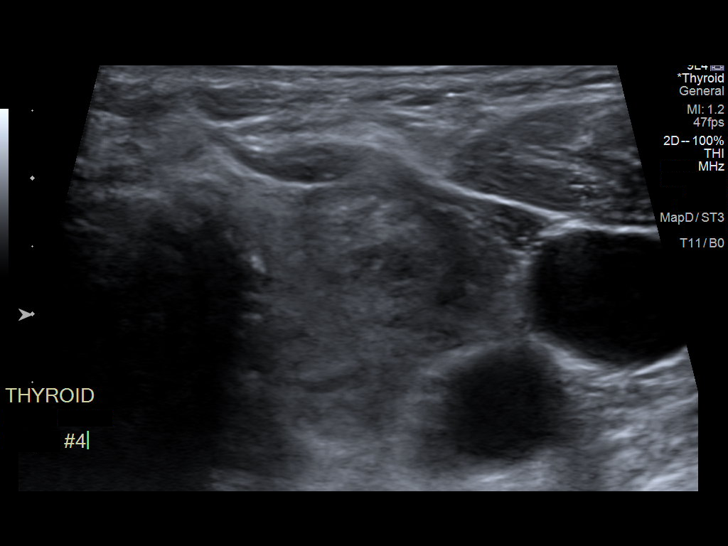
[im 10/11]
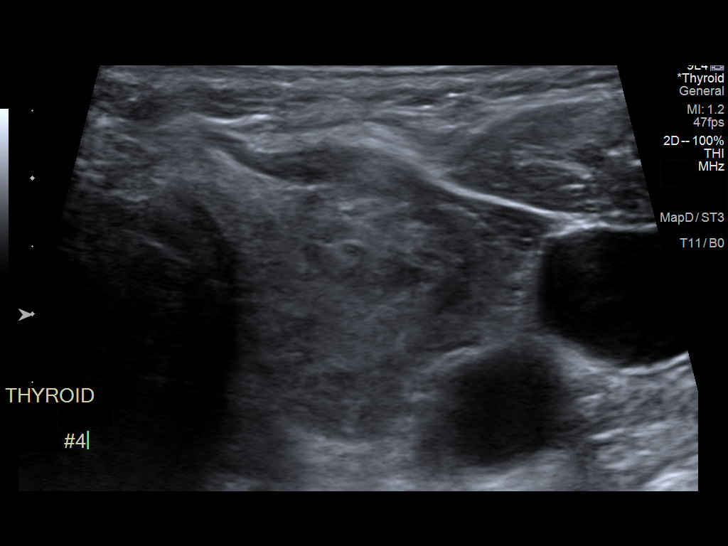
[im 11/11]
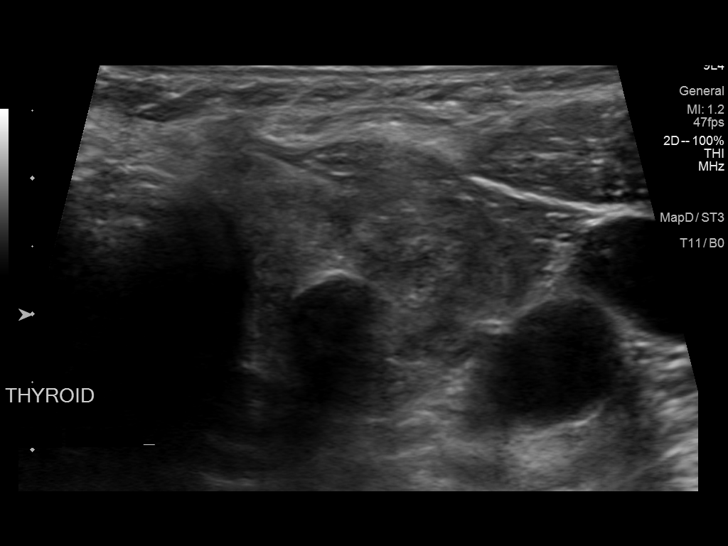

[11 of 11 positions shown; findings below may reference images not displayed]

Pre-procedural ultrasound scanning demonstrated unchanged size and
appearance of the indeterminate nodule within the left mid thyroid.

The procedure was planned. The neck was prepped in the usual sterile
fashion, and a sterile drape was applied covering the operative
field. A timeout was performed prior to the initiation of the
procedure. Local anesthesia was provided with 1% lidocaine.

Under direct ultrasound guidance, 4 FNA biopsies were performed of
the left mid thyroid nodule with a 25 gauge needle. Multiple
ultrasound images were saved for procedural documentation purposes.
The samples were prepared and submitted to pathology.

Limited post procedural scanning was negative for hematoma or
additional complication. Dressings were placed. The patient
tolerated the above procedures procedure well without immediate
postprocedural complication.
FINDINGS: FINDINGS
Nodule reference number based on prior diagnostic ultrasound: 2

Maximum size:  1.9 cm

Location: Left  ;  Mid

ACR TI-RADS total points: 7

ACR TI-RADS risk category:  TR5 (>/= 7 points)

Prior biopsy:  No

Reason for biopsy: meets ACR TI-RADS criteria

Ultrasound imaging confirms appropriate placement of the needles
within the thyroid nodule.
IMPRESSION: Technically successful ultrasound guided fine needle aspiration of
left mid thyroid nodule as described above.

## 2018-06-01 DIAGNOSIS — M7989 Other specified soft tissue disorders: Secondary | ICD-10-CM | POA: Diagnosis not present

## 2018-06-01 DIAGNOSIS — M1812 Unilateral primary osteoarthritis of first carpometacarpal joint, left hand: Secondary | ICD-10-CM | POA: Diagnosis not present

## 2018-06-01 DIAGNOSIS — S61452A Open bite of left hand, initial encounter: Secondary | ICD-10-CM | POA: Diagnosis not present

## 2018-06-05 DIAGNOSIS — W540XXD Bitten by dog, subsequent encounter: Secondary | ICD-10-CM | POA: Diagnosis not present

## 2018-06-05 DIAGNOSIS — M7989 Other specified soft tissue disorders: Secondary | ICD-10-CM | POA: Diagnosis not present

## 2018-06-21 DIAGNOSIS — M7061 Trochanteric bursitis, right hip: Secondary | ICD-10-CM | POA: Diagnosis not present

## 2018-06-21 DIAGNOSIS — M461 Sacroiliitis, not elsewhere classified: Secondary | ICD-10-CM | POA: Diagnosis not present

## 2018-06-21 DIAGNOSIS — Z981 Arthrodesis status: Secondary | ICD-10-CM | POA: Diagnosis not present

## 2018-06-25 DIAGNOSIS — I10 Essential (primary) hypertension: Secondary | ICD-10-CM | POA: Diagnosis not present

## 2018-06-25 DIAGNOSIS — E1165 Type 2 diabetes mellitus with hyperglycemia: Secondary | ICD-10-CM | POA: Diagnosis not present

## 2018-06-25 DIAGNOSIS — J302 Other seasonal allergic rhinitis: Secondary | ICD-10-CM | POA: Diagnosis not present

## 2018-06-25 DIAGNOSIS — E785 Hyperlipidemia, unspecified: Secondary | ICD-10-CM | POA: Diagnosis not present

## 2018-06-28 DIAGNOSIS — M461 Sacroiliitis, not elsewhere classified: Secondary | ICD-10-CM | POA: Diagnosis not present

## 2018-07-05 DIAGNOSIS — E559 Vitamin D deficiency, unspecified: Secondary | ICD-10-CM | POA: Diagnosis not present

## 2018-07-05 DIAGNOSIS — E1165 Type 2 diabetes mellitus with hyperglycemia: Secondary | ICD-10-CM | POA: Diagnosis not present

## 2018-07-05 DIAGNOSIS — E785 Hyperlipidemia, unspecified: Secondary | ICD-10-CM | POA: Diagnosis not present

## 2018-07-05 DIAGNOSIS — I10 Essential (primary) hypertension: Secondary | ICD-10-CM | POA: Diagnosis not present

## 2018-07-10 DIAGNOSIS — E1165 Type 2 diabetes mellitus with hyperglycemia: Secondary | ICD-10-CM | POA: Diagnosis not present

## 2018-07-10 DIAGNOSIS — M792 Neuralgia and neuritis, unspecified: Secondary | ICD-10-CM | POA: Diagnosis not present

## 2018-07-10 DIAGNOSIS — E559 Vitamin D deficiency, unspecified: Secondary | ICD-10-CM | POA: Diagnosis not present

## 2018-07-10 DIAGNOSIS — M19071 Primary osteoarthritis, right ankle and foot: Secondary | ICD-10-CM | POA: Diagnosis not present

## 2018-07-10 DIAGNOSIS — Z0000 Encounter for general adult medical examination without abnormal findings: Secondary | ICD-10-CM | POA: Diagnosis not present

## 2018-07-10 DIAGNOSIS — M2041 Other hammer toe(s) (acquired), right foot: Secondary | ICD-10-CM | POA: Diagnosis not present

## 2018-07-10 DIAGNOSIS — I129 Hypertensive chronic kidney disease with stage 1 through stage 4 chronic kidney disease, or unspecified chronic kidney disease: Secondary | ICD-10-CM | POA: Diagnosis not present

## 2018-07-10 DIAGNOSIS — M19072 Primary osteoarthritis, left ankle and foot: Secondary | ICD-10-CM | POA: Diagnosis not present

## 2018-07-10 DIAGNOSIS — E782 Mixed hyperlipidemia: Secondary | ICD-10-CM | POA: Diagnosis not present

## 2018-07-10 DIAGNOSIS — N183 Chronic kidney disease, stage 3 (moderate): Secondary | ICD-10-CM | POA: Diagnosis not present

## 2018-07-10 DIAGNOSIS — K591 Functional diarrhea: Secondary | ICD-10-CM | POA: Diagnosis not present

## 2018-08-06 DIAGNOSIS — R8290 Unspecified abnormal findings in urine: Secondary | ICD-10-CM | POA: Diagnosis not present

## 2018-08-06 DIAGNOSIS — R600 Localized edema: Secondary | ICD-10-CM | POA: Diagnosis not present

## 2018-08-06 DIAGNOSIS — K591 Functional diarrhea: Secondary | ICD-10-CM | POA: Diagnosis not present

## 2018-08-06 DIAGNOSIS — R05 Cough: Secondary | ICD-10-CM | POA: Diagnosis not present

## 2018-08-09 DIAGNOSIS — E119 Type 2 diabetes mellitus without complications: Secondary | ICD-10-CM | POA: Diagnosis not present

## 2018-08-09 DIAGNOSIS — M2041 Other hammer toe(s) (acquired), right foot: Secondary | ICD-10-CM | POA: Diagnosis not present

## 2018-08-09 DIAGNOSIS — I739 Peripheral vascular disease, unspecified: Secondary | ICD-10-CM | POA: Diagnosis not present

## 2018-09-06 DIAGNOSIS — M19072 Primary osteoarthritis, left ankle and foot: Secondary | ICD-10-CM | POA: Diagnosis not present

## 2018-09-06 DIAGNOSIS — M2041 Other hammer toe(s) (acquired), right foot: Secondary | ICD-10-CM | POA: Diagnosis not present

## 2018-09-06 DIAGNOSIS — E1151 Type 2 diabetes mellitus with diabetic peripheral angiopathy without gangrene: Secondary | ICD-10-CM | POA: Diagnosis not present

## 2018-09-18 DIAGNOSIS — E119 Type 2 diabetes mellitus without complications: Secondary | ICD-10-CM | POA: Diagnosis not present

## 2018-09-19 DIAGNOSIS — M461 Sacroiliitis, not elsewhere classified: Secondary | ICD-10-CM | POA: Diagnosis not present

## 2018-09-25 DIAGNOSIS — E119 Type 2 diabetes mellitus without complications: Secondary | ICD-10-CM | POA: Diagnosis not present
